# Patient Record
Sex: Female | Born: 1985 | Race: White | Hispanic: Yes | Marital: Married | Smoking: Never smoker
Health system: Southern US, Community
[De-identification: ages and names within clinical notes are randomized; demographics above are authoritative.]

## PROBLEM LIST (undated history)

## (undated) ENCOUNTER — Inpatient Hospital Stay (HOSPITAL_COMMUNITY): Payer: Managed Care, Other (non HMO)

## (undated) DIAGNOSIS — M25559 Pain in unspecified hip: Secondary | ICD-10-CM

## (undated) DIAGNOSIS — M549 Dorsalgia, unspecified: Secondary | ICD-10-CM

## (undated) DIAGNOSIS — N83209 Unspecified ovarian cyst, unspecified side: Secondary | ICD-10-CM

## (undated) HISTORY — DX: Unspecified ovarian cyst, unspecified side: N83.209

## (undated) HISTORY — DX: Dorsalgia, unspecified: M54.9

## (undated) HISTORY — PX: CHOLECYSTECTOMY: SHX55

## (undated) HISTORY — PX: KIDNEY STONE SURGERY: SHX686

## (undated) HISTORY — DX: Pain in unspecified hip: M25.559

---

## 2014-06-14 DIAGNOSIS — N83209 Unspecified ovarian cyst, unspecified side: Secondary | ICD-10-CM

## 2014-06-14 HISTORY — DX: Unspecified ovarian cyst, unspecified side: N83.209

## 2016-05-26 ENCOUNTER — Ambulatory Visit (INDEPENDENT_AMBULATORY_CARE_PROVIDER_SITE_OTHER): Payer: BLUE CROSS/BLUE SHIELD | Admitting: Obstetrics and Gynecology

## 2016-05-26 ENCOUNTER — Encounter: Payer: Self-pay | Admitting: Obstetrics and Gynecology

## 2016-05-26 VITALS — BP 102/60 | HR 68 | Resp 16 | Ht 65.0 in | Wt 198.0 lb

## 2016-05-26 DIAGNOSIS — R5383 Other fatigue: Secondary | ICD-10-CM

## 2016-05-26 DIAGNOSIS — Z01419 Encounter for gynecological examination (general) (routine) without abnormal findings: Secondary | ICD-10-CM | POA: Diagnosis not present

## 2016-05-26 DIAGNOSIS — R102 Pelvic and perineal pain: Secondary | ICD-10-CM

## 2016-05-26 DIAGNOSIS — Z124 Encounter for screening for malignant neoplasm of cervix: Secondary | ICD-10-CM

## 2016-05-26 DIAGNOSIS — Z0000 Encounter for general adult medical examination without abnormal findings: Secondary | ICD-10-CM

## 2016-05-26 DIAGNOSIS — Z3041 Encounter for surveillance of contraceptive pills: Secondary | ICD-10-CM | POA: Diagnosis not present

## 2016-05-26 DIAGNOSIS — N83202 Unspecified ovarian cyst, left side: Secondary | ICD-10-CM

## 2016-05-26 LAB — CBC
HCT: 42.5 % (ref 35.0–45.0)
Hemoglobin: 14.3 g/dL (ref 11.7–15.5)
MCH: 32.6 pg (ref 27.0–33.0)
MCHC: 33.6 g/dL (ref 32.0–36.0)
MCV: 97 fL (ref 80.0–100.0)
MPV: 10.6 fL (ref 7.5–12.5)
PLATELETS: 216 10*3/uL (ref 140–400)
RBC: 4.38 MIL/uL (ref 3.80–5.10)
RDW: 12.2 % (ref 11.0–15.0)
WBC: 5.5 10*3/uL (ref 3.8–10.8)

## 2016-05-26 LAB — COMPREHENSIVE METABOLIC PANEL
ALK PHOS: 36 U/L (ref 33–115)
ALT: 25 U/L (ref 6–29)
AST: 23 U/L (ref 10–30)
Albumin: 4.5 g/dL (ref 3.6–5.1)
BUN: 15 mg/dL (ref 7–25)
CHLORIDE: 102 mmol/L (ref 98–110)
CO2: 25 mmol/L (ref 20–31)
CREATININE: 0.85 mg/dL (ref 0.50–1.10)
Calcium: 9.5 mg/dL (ref 8.6–10.2)
GLUCOSE: 81 mg/dL (ref 65–99)
POTASSIUM: 4.1 mmol/L (ref 3.5–5.3)
SODIUM: 138 mmol/L (ref 135–146)
TOTAL PROTEIN: 7.3 g/dL (ref 6.1–8.1)
Total Bilirubin: 0.6 mg/dL (ref 0.2–1.2)

## 2016-05-26 LAB — LIPID PANEL
CHOL/HDL RATIO: 5.5 ratio — AB (ref <5.0)
CHOLESTEROL: 208 mg/dL — AB (ref <200)
HDL: 38 mg/dL — ABNORMAL LOW (ref >50)
LDL Cholesterol: 122 mg/dL — ABNORMAL HIGH (ref <100)
Triglycerides: 241 mg/dL — ABNORMAL HIGH (ref <150)
VLDL: 48 mg/dL — ABNORMAL HIGH (ref <30)

## 2016-05-26 LAB — TSH: TSH: 1.32 mIU/L

## 2016-05-26 MED ORDER — BLISOVI 24 FE 1-20 MG-MCG(24) PO TABS: 1.0000 | ORAL_TABLET | Freq: Every day | ORAL | 3 refills | Status: DC

## 2016-05-26 NOTE — Progress Notes (Signed)
30 y.o. DE:[id number] MarriedHispanicF here for annual exam. Patient is c/o LLQ pain.  The patient had large cyst on her left ovary last year. She was started on OCP's, never had a f/u ultrasound. She c/o intermittent pain in her LLQ for the last 4-5 months. She did have pain last year when the cyst was diagnosed but that pain resolved.  The current pain occurs a couple of times a week, sometimes she feels bloated. The pain can last for up to 3 hours. The pain is a burning pain, up to a 6/10 in severity. Normal BM every day, she does have more gas. No urinary c/o. She does c/o fatigue. No pain with intercourse.  She was on OCP's, ran out 2 weeks ago.  Period Cycle (Days): 28 Period Duration (Days): 4 days  Period Pattern: Regular Menstrual Flow: Moderate Menstrual Control: Thin pad, Maxi pad Dysmenorrhea: (!) Moderate Dysmenorrhea Symptoms: Cramping  Patient's last menstrual period was 05/23/2016.          Sexually active: Yes.    The current method of family planning is none.    Exercising: No.  The patient does not participate in regular exercise at present. Smoker:  no  Health Maintenance: Pap:  2016 WNL per patient  History of abnormal Pap:  no TDaP:  unsure Gardasil: no    reports that she has never smoked. She has never used smokeless tobacco. 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   Past Medical History:  Diagnosis Date  . Ovarian cyst 2016   left     Past Surgical History:  Procedure Laterality Date  . CESAREAN SECTION    . KIDNEY STONE SURGERY    C/S x 1  Current Outpatient Prescriptions  Medication Sig Dispense Refill  . BLISOVI 24 FE 1-20 MG-MCG(24) tablet     . COLLAGEN PO Take by mouth.     No current facility-administered medications for this visit.     Family History  Problem Relation Age of Onset  . Diabetes Father     Review of Systems  Constitutional: Negative.   HENT: Negative.    Eyes: Negative.   Respiratory: Negative.   Cardiovascular: Negative.   Gastrointestinal: Negative.   Endocrine: Negative.   Genitourinary: Positive for pelvic pain.  Musculoskeletal: Negative.   Skin: Negative.   Allergic/Immunologic: Negative.   Neurological: Negative.   Psychiatric/Behavioral: Negative.     Exam:   BP 102/60 (BP Location: Right Arm, Patient Position: Sitting, Cuff Size: Normal)   Pulse 68   Resp 16   Ht 5\' 5"  (1.651 m)   Wt 198 lb (89.8 kg)   LMP 05/23/2016   BMI 32.95 kg/m   Weight change: @WEIGHTCHANGE @ Height:   Height: 5\' 5"  (165.1 cm)  Ht Readings from Last 3 Encounters:  05/26/16 5\' 5"  (1.651 m)    General appearance: alert, cooperative and appears stated age Head: Normocephalic, without obvious abnormality, atraumatic Neck: no adenopathy, supple, symmetrical, trachea midline and thyroid normal to inspection and palpation Lungs: clear to auscultation bilaterally Cardiovascular: regular rate and rhythm Breasts: normal appearance, no masses or tenderness Heart: regular rate and rhythm Abdomen: soft, mildly tender in the LLQ in the region of the descending bowel; bowel sounds normal; no masses,  no organomegaly Extremities: extremities normal, atraumatic, no cyanosis or edema Skin: Skin color, texture, turgor normal. No rashes or lesions Lymph nodes: Cervical, supraclavicular, and axillary nodes normal. No abnormal inguinal  nodes palpated Neurologic: Grossly normal   Pelvic: External genitalia:  no lesions              Urethra:  normal appearing urethra with no masses, tenderness or lesions              Bartholins and Skenes: normal                 Vagina: normal appearing vagina with normal color and discharge, no lesions              Cervix: no lesions               Bimanual Exam:  Uterus:  normal size, contour, position, consistency, mobility, non-tender and anteverted              Adnexa: tender on the left, no clear mass noted                Rectovaginal: Confirms               Anus:  normal sphincter tone, no lesions  Chaperone was present for exam.  A:  Well Woman with normal exam  LLQ pelvic pain  Bloating  H/O large left ovarian cyst   Fatigue, will check CBC and TSH  P:   Pap with hpv  Screening labs  Restart OCP's  Discussed calcium and vit D  Schedule GYN ultrasound  Consider referral to GI if her u/s is normal

## 2016-05-26 NOTE — Patient Instructions (Signed)

## 2016-05-27 ENCOUNTER — Telehealth: Payer: Self-pay | Admitting: Obstetrics and Gynecology

## 2016-05-27 LAB — VITAMIN D 25 HYDROXY (VIT D DEFICIENCY, FRACTURES): VIT D 25 HYDROXY: 28 ng/mL — AB (ref 30–100)

## 2016-05-27 NOTE — Telephone Encounter (Signed)
Called patient to review benefits for a recommended procedure. Left Voicemail requesting a call back. °

## 2016-05-28 NOTE — Telephone Encounter (Signed)
Spoke with patient regarding benefit for u;trasound. Patient understood and agreeable. Patient ready to schedule. Patient scheduled 06/01/16 with Dr Talbert Nan. Patient aware of date,arrival time and cancellation policy. No further questions. Ok to close

## 2016-06-01 ENCOUNTER — Ambulatory Visit (INDEPENDENT_AMBULATORY_CARE_PROVIDER_SITE_OTHER): Payer: BLUE CROSS/BLUE SHIELD | Admitting: Obstetrics and Gynecology

## 2016-06-01 ENCOUNTER — Encounter: Payer: Self-pay | Admitting: Obstetrics and Gynecology

## 2016-06-01 ENCOUNTER — Ambulatory Visit (INDEPENDENT_AMBULATORY_CARE_PROVIDER_SITE_OTHER): Payer: BLUE CROSS/BLUE SHIELD

## 2016-06-01 VITALS — BP 110/70 | HR 64 | Resp 14 | Wt 194.0 lb

## 2016-06-01 DIAGNOSIS — N83202 Unspecified ovarian cyst, left side: Secondary | ICD-10-CM | POA: Diagnosis not present

## 2016-06-01 DIAGNOSIS — R102 Pelvic and perineal pain: Secondary | ICD-10-CM | POA: Diagnosis not present

## 2016-06-01 LAB — IPS PAP TEST WITH HPV

## 2016-06-01 NOTE — Patient Instructions (Signed)
Laparoscopia de diagnstico (Diagnostic Laparoscopy) La laparoscopia de diagnstico es un procedimiento que se hace para diagnosticar enfermedades en el abdomen. Durante su realizacin, se introduce en el abdomen un instrumento delgado del tamao de un lpiz que tiene Edmund luz, llamado laparoscopio, a travs de una incisin. El laparoscopio le permite al mdico observar los rganos internos. INFORME A SU MDICO:  Cualquier alergia que tenga.  Todos los [employer], incluidos vitaminas, hierbas, gotas oftlmicas, cremas y medicamentos de venta libre.  Problemas previos que usted o los [employer] de su familia hayan tenido con el uso de anestsicos.  Enfermedades de la sangre que tenga.  Si tiene cirugas previas.  Enfermedades que tenga. RIESGOS Y COMPLICACIONES En general, se trata de un procedimiento seguro. Sin embargo, es posible que haya problemas, que pueden incluir lo siguiente:  Infeccin.  Hemorragia.  Lesiones en los rganos circundantes.  Reaccin alrgica a la anestesia usada durante el procedimiento. ANTES DEL PROCEDIMIENTO  No coma ni beba nada despus de la medianoche anterior al procedimiento o segn lo que le haya indicado el mdico.  Consulte a su mdico acerca de estos temas:  Cambiar o suspender los medicamentos que toma habitualmente.  Tomar medicamentos, como aspirina e ibuprofeno. Estos medicamentos pueden tener un efecto anticoagulante en la [place]. No tome estos medicamentos antes del procedimiento si el mdico le indica que no lo haga.  Haga planes para que una persona lo lleve de vuelta a su casa despus del procedimiento. PROCEDIMIENTO  Le administrarn un medicamento para ayudarlo a relajarse (sedante).  Le administrarn un medicamento que lo har dormir (anestesia general).  Se inflar el abdomen con un gas, lo que facilitar la observacin.  Le practicarn incisiones pequeas en el abdomen.  A travs de las incisiones, se  introducirn un laparoscopio y otros instrumentos pequeos en el abdomen.  Se puede tomar Truddie Coco de tejido de un rgano del abdomen para su anlisis.  Se retirarn los instrumentos del abdomen.  Se extraer el gas.  Las incisiones se cerrarn con puntos (suturas). DESPUS DEL PROCEDIMIENTO Le controlarn con frecuencia la presin arterial, la frecuencia cardaca, la frecuencia respiratoria y Retail buyer de oxgeno en la sangre hasta que haya desaparecido el efecto de los medicamentos administrados. Esta informacin no tiene Marine scientist el consejo del mdico. Asegrese de hacerle al mdico cualquier pregunta que tenga. Document Released: 05/31/2005 Document Revised: 06/21/2014 Document Reviewed: 01/11/2014 Elsevier Interactive Patient Education  2017 [employer].

## 2016-06-01 NOTE — Progress Notes (Signed)
GYNECOLOGY  VISIT   HPI: 30 y.o.   Married  Hispanic  female   [phone] with Patient's last menstrual period was 05/23/2016.   here for follow up history of ovarian cyst. The patient was told she had a large left ovarian cyst last year. She moved and didn't have f/u until now. She is having intermittent pain in her LLQ for the last 4-5 months.    GYNECOLOGIC HISTORY: Patient's last menstrual period was 05/23/2016. Contraception:OCP Menopausal hormone therapy: none         OB History    Gravida Para Term Preterm AB Living   2 2 2     2    SAB TAB Ectopic Multiple Live Births           2         There are no active problems to display for this patient.   Past Medical History:  Diagnosis Date  . Ovarian cyst 2016   left     Past Surgical History:  Procedure Laterality Date  . CESAREAN SECTION    . KIDNEY STONE SURGERY      Current Outpatient Prescriptions  Medication Sig Dispense Refill  . BLISOVI 24 FE 1-20 MG-MCG(24) tablet Take 1 tablet by mouth daily. 3 Package 3  . COLLAGEN PO Take by mouth.     No current facility-administered medications for this visit.      ALLERGIES: Patient has no known allergies.  Family History  Problem Relation Age of Onset  . Diabetes Father     Social History   Social History  . Marital status: Married    Spouse name: N/A  . Number of children: N/A  . Years of education: N/A   Occupational History  . Not on file.   Social History Main Topics  . Smoking status: Never Smoker  . Smokeless tobacco: Never Used  . Alcohol use No  . Drug use: No  . Sexual activity: Yes    Partners: Male    Birth control/ protection: None   Other Topics Concern  . Not on file   Social History Narrative  . No narrative on file    Review of Systems  Constitutional: Negative.   HENT: Negative.   Eyes: Negative.   Respiratory: Negative.   Cardiovascular: Negative.   Gastrointestinal: Negative.   Genitourinary: Negative.    Musculoskeletal: Negative.   Skin: Negative.   Neurological: Negative.   Endo/Heme/Allergies: Negative.   Psychiatric/Behavioral: Negative.     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 Removal recommended    PLAN Laparoscopic left oophorectomy Discussed risks of bleeding, infection, damage to bowel/bladder/vessels/ureters   An After Visit Summary was printed and given to the patient.  15 minutes face to face time of which over 50% was spent in counseling.

## 2016-06-10 ENCOUNTER — Other Ambulatory Visit (INDEPENDENT_AMBULATORY_CARE_PROVIDER_SITE_OTHER): Payer: BLUE CROSS/BLUE SHIELD

## 2016-06-10 ENCOUNTER — Other Ambulatory Visit: Payer: Self-pay

## 2016-06-10 DIAGNOSIS — E785 Hyperlipidemia, unspecified: Secondary | ICD-10-CM

## 2016-06-10 DIAGNOSIS — E559 Vitamin D deficiency, unspecified: Secondary | ICD-10-CM

## 2016-06-11 LAB — LIPID PANEL
Cholesterol: 171 mg/dL (ref <200)
HDL: 46 mg/dL — ABNORMAL LOW (ref >50)
LDL CALC: 91 mg/dL (ref <100)
Total CHOL/HDL Ratio: 3.7 Ratio (ref <5.0)
Triglycerides: 172 mg/dL — ABNORMAL HIGH (ref <150)
VLDL: 34 mg/dL — ABNORMAL HIGH (ref <30)

## 2016-06-15 ENCOUNTER — Telehealth: Payer: Self-pay | Admitting: *Deleted

## 2016-06-15 NOTE — Telephone Encounter (Signed)
-----   Message from Nunzio Cobbs, MD sent at 06/13/2016 12:00 PM EST ----- This is Dr. Quincy Simmonds reviewing labs for Dr. Talbert Nan.  Please report repeat cholesterol panel to the patient.  She made great improvements in her cholesterol ratios and her triglycerides.  I recommend a diet low in saturated fats and cholesterol and vigorous exercise most days of the week.  She can test her cholesterol yearly at her annual exams with Dr. Talbert Nan.

## 2016-06-15 NOTE — Telephone Encounter (Signed)
Left message to call regarding lab results -eh 

## 2016-06-16 ENCOUNTER — Telehealth: Payer: Self-pay | Admitting: Obstetrics and Gynecology

## 2016-06-16 NOTE — Telephone Encounter (Signed)
Spoke with patient and gave results. See result note -eh

## 2016-06-16 NOTE — Telephone Encounter (Signed)
Spoke with patient in regards to benefits for recommended surgical procedure. Patient understood information presented. Patient advises she would like to discuss with her spouse before proceeding with scheduling. Patient to call back to our office after speaking with spouse.  Routing to [employer]

## 2016-06-16 NOTE — Telephone Encounter (Signed)
Call from patient's husband, Kevan Rosebush. (permission to speak to husband per [facility]). Desires to proceed with scheduling first available date. Has information for business office regarding insurance change. Call transferred to [facility] in business office. Will call back tomorrow with date options.

## 2016-06-22 NOTE — Telephone Encounter (Signed)
Call to patient. Discussed date of 06-29-16 and patient is agreeable. Will schedule and call her back once confirmed. Case request sent to central scheduling.

## 2016-06-22 NOTE — Telephone Encounter (Signed)
Patient called requesting to schedule surgery. °

## 2016-06-22 NOTE — Telephone Encounter (Signed)
Call to patient. Advised surgery confirmed for 06-29-16 at 0730 at [place] appointment scheduled with Dr Talbert Nan for tomorrow at 1030 to review surgery plan and instructions.   Routing to provider for final review. Patient agreeable to disposition. Will close encounter.

## 2016-06-23 ENCOUNTER — Encounter: Payer: Self-pay | Admitting: Obstetrics and Gynecology

## 2016-06-23 ENCOUNTER — Ambulatory Visit: Payer: Managed Care, Other (non HMO) | Admitting: Obstetrics and Gynecology

## 2016-06-23 VITALS — BP 102/60 | HR 64 | Resp 15 | Wt 203.0 lb

## 2016-06-23 DIAGNOSIS — N83202 Unspecified ovarian cyst, left side: Secondary | ICD-10-CM

## 2016-06-23 NOTE — H&P (Signed)
2. Nicholes Rough, CMA (Certified Medical Assistant) at 06/23/2016 10:49 AM - Sign at close encounter    GYNECOLOGY  VISIT   HPI: 31 y.o.   Married  Hispanic  female   G2P2002 with Patient's last menstrual period was 06/22/2016.   here for surgery consult, Patient is scheduled for laparoscopic oophorectomy (Left) 06/29/16. The patient has a 4-5 month h/o intermittent LLQ abdominal pain, worse with her cycle. Ultrasound with a 10 x 8 x 11 cm complex left ovarian mass, c/w a mucinous cystadenoma.      GYNECOLOGIC HISTORY: Patient's last menstrual period was 06/22/2016. Contraception:OCP Menopausal hormone therapy: none                 OB History    Gravida Para Term Preterm AB Living   2 2 2     2    SAB TAB Ectopic Multiple Live Births           2         There are no active problems to display for this patient.       Past Medical History:  Diagnosis Date  . Ovarian cyst 2016   left          Past Surgical History:  Procedure Laterality Date  . CESAREAN SECTION    . KIDNEY STONE SURGERY    Laparoscopic surgery for her kidney stone        Current Outpatient Prescriptions  Medication Sig Dispense Refill  . BLISOVI 24 FE 1-20 MG-MCG(24) tablet Take 1 tablet by mouth daily. 3 Package 3  . Cholecalciferol (VITAMIN D PO) Take 1 capsule by mouth daily.    . COLLAGEN PO Take 3 tablets by mouth daily.     No current facility-administered medications for this visit.      ALLERGIES: Patient has no known allergies.       Family History  Problem Relation Age of Onset  . Diabetes Father     Social History        Social History  . Marital status: Married    Spouse name: N/A  . Number of children: N/A  . Years of education: N/A      Occupational History  . Not on file.        Social History Main Topics  . Smoking status: Never Smoker  . Smokeless tobacco: Never Used  . Alcohol use No  . Drug use: No  . Sexual activity: Yes     Partners: Male    Birth control/ protection: None       Other Topics Concern  . Not on file      Social History Narrative  . No narrative on file    Review of Systems  Constitutional: Negative.   HENT: Negative.   Eyes: Negative.   Respiratory: Negative.   Cardiovascular: Negative.   Gastrointestinal: Negative.   Genitourinary: Negative.   Musculoskeletal: Negative.   Skin: Negative.   Neurological: Negative.   Endo/Heme/Allergies: Negative.   Psychiatric/Behavioral: Negative.     PHYSICAL EXAMINATION:    BP 102/60 (BP Location: Right Arm, Patient Position: Sitting, Cuff Size: Normal)   Pulse 64   Resp 15   Wt 203 lb (92.1 kg)   LMP 06/22/2016   BMI 33.78 kg/m     General appearance: alert, cooperative and appears stated age Neck: no adenopathy, supple, symmetrical, trachea midline and thyroid normal to inspection and palpation Heart: regular rate and rhythm Lungs: CTAB Abdomen: soft, non-tender; bowel  sounds normal; no masses,  no organomegaly Skin: normal color, texture and turgor, no rashes or lesions Lymph: normal cervical supraclavicular and inguinal nodes Neurologic: grossly normal   ASSESSMENT 11 cm complex left ovarian cyst, c/w a mucinous cystadenoma    PLAN Laparoscopic left oophorectomy Discussed surgery and recovery time Discussed risks, including: bleeding, infection, damage to bowel/bladder/vessels/ureters, need for further surgery   An After Visit Summary was printed and given to the patient.

## 2016-06-23 NOTE — Progress Notes (Signed)
GYNECOLOGY  VISIT   HPI: 31 y.o.   Married  Hispanic  female   G2P2002 with Patient's last menstrual period was 06/22/2016.   here for surgery consult, Patient is scheduled for laparoscopic oophorectomy (Left) 06/29/16. The patient has a 4-5 month h/o intermittent LLQ abdominal pain, worse with her cycle. Ultrasound with a 10 x 8 x 11 cm complex left ovarian mass, c/w a mucinous cystadenoma.      GYNECOLOGIC HISTORY: Patient's last menstrual period was 06/22/2016. Contraception:OCP Menopausal hormone therapy: none         OB History    Gravida Para Term Preterm AB Living   2 2 2     2    SAB TAB Ectopic Multiple Live Births           2         There are no active problems to display for this patient.   Past Medical History:  Diagnosis Date  . Ovarian cyst 2016   left     Past Surgical History:  Procedure Laterality Date  . CESAREAN SECTION    . KIDNEY STONE SURGERY    Laparoscopic surgery for her kidney stone  Current Outpatient Prescriptions  Medication Sig Dispense Refill  . BLISOVI 24 FE 1-20 MG-MCG(24) tablet Take 1 tablet by mouth daily. 3 Package 3  . Cholecalciferol (VITAMIN D PO) Take 1 capsule by mouth daily.    . COLLAGEN PO Take 3 tablets by mouth daily.     No current facility-administered medications for this visit.      ALLERGIES: Patient has no known allergies.  Family History  Problem Relation Age of Onset  . Diabetes Father     Social History   Social History  . Marital status: Married    Spouse name: N/A  . Number of children: N/A  . Years of education: N/A   Occupational History  . Not on file.   Social History Main Topics  . Smoking status: Never Smoker  . Smokeless tobacco: Never Used  . Alcohol use No  . Drug use: No  . Sexual activity: Yes    Partners: Male    Birth control/ protection: None   Other Topics Concern  . Not on file   Social History Narrative  . No narrative on file    Review of Systems  Constitutional:  Negative.   HENT: Negative.   Eyes: Negative.   Respiratory: Negative.   Cardiovascular: Negative.   Gastrointestinal: Negative.   Genitourinary: Negative.   Musculoskeletal: Negative.   Skin: Negative.   Neurological: Negative.   Endo/Heme/Allergies: Negative.   Psychiatric/Behavioral: Negative.     PHYSICAL EXAMINATION:    BP 102/60 (BP Location: Right Arm, Patient Position: Sitting, Cuff Size: Normal)   Pulse 64   Resp 15   Wt 203 lb (92.1 kg)   LMP 06/22/2016   BMI 33.78 kg/m     General appearance: alert, cooperative and appears stated age Neck: no adenopathy, supple, symmetrical, trachea midline and thyroid normal to inspection and palpation Heart: regular rate and rhythm Lungs: CTAB Abdomen: soft, non-tender; bowel sounds normal; no masses,  no organomegaly Skin: normal color, texture and turgor, no rashes or lesions Lymph: normal cervical supraclavicular and inguinal nodes Neurologic: grossly normal   ASSESSMENT 11 cm complex left ovarian cyst, c/w a mucinous cystadenoma    PLAN Laparoscopic left oophorectomy Discussed surgery and recovery time Discussed risks, including: bleeding, infection, damage to bowel/bladder/vessels/ureters, need for further surgery   An After Visit  Summary was printed and given to the patient.

## 2016-06-28 ENCOUNTER — Encounter (HOSPITAL_COMMUNITY): Payer: Self-pay | Admitting: Anesthesiology

## 2016-06-28 NOTE — Anesthesia Preprocedure Evaluation (Addendum)
Anesthesia Evaluation  Patient identified by MRN, date of birth, ID band Patient awake    Reviewed: Allergy & Precautions, NPO status , Patient's Chart, lab work & pertinent test results  Airway Mallampati: II  TM Distance: >3 FB Neck ROM: Full    Dental  (+) Teeth Intact, Dental Advisory Given   Pulmonary neg pulmonary ROS,    breath sounds clear to auscultation       Cardiovascular negative cardio ROS   Rhythm:Regular Rate:Normal     Neuro/Psych negative neurological ROS  negative psych ROS   GI/Hepatic negative GI ROS, Neg liver ROS,   Endo/Other  negative endocrine ROS  Renal/GU negative Renal ROS  negative genitourinary   Musculoskeletal negative musculoskeletal ROS (+)   Abdominal   Peds negative pediatric ROS (+)  Hematology negative hematology ROS (+)   Anesthesia Other Findings   Reproductive/Obstetrics negative OB ROS                            Anesthesia Physical Anesthesia Plan  ASA: II  Anesthesia Plan: General   Post-op Pain Management:    Induction: Intravenous  Airway Management Planned: Oral ETT  Additional Equipment:   Intra-op Plan:   Post-operative Plan: Extubation in OR  Informed Consent: I have reviewed the patients History and Physical, chart, labs and discussed the procedure including the risks, benefits and alternatives for the proposed anesthesia with the patient or authorized representative who has indicated his/her understanding and acceptance.   Dental advisory given  Plan Discussed with: CRNA  Anesthesia Plan Comments:         Anesthesia Quick Evaluation

## 2016-06-29 ENCOUNTER — Ambulatory Visit (HOSPITAL_COMMUNITY): Payer: Managed Care, Other (non HMO) | Admitting: Anesthesiology

## 2016-06-29 ENCOUNTER — Encounter (HOSPITAL_COMMUNITY): Payer: Self-pay | Admitting: *Deleted

## 2016-06-29 ENCOUNTER — Encounter (HOSPITAL_COMMUNITY)
Admission: RE | Disposition: A | Discharge status: Home / Self Care | Payer: Self-pay | Source: Ambulatory Visit | Attending: Obstetrics and Gynecology

## 2016-06-29 ENCOUNTER — Ambulatory Visit (HOSPITAL_COMMUNITY)
Admission: RE | Admit: 2016-06-29 | Discharge: 2016-06-29 | Disposition: A | Discharge status: Home / Self Care | Payer: Managed Care, Other (non HMO) | Source: Ambulatory Visit | Attending: Obstetrics and Gynecology | Admitting: Obstetrics and Gynecology

## 2016-06-29 DIAGNOSIS — N83202 Unspecified ovarian cyst, left side: Secondary | ICD-10-CM | POA: Diagnosis not present

## 2016-06-29 DIAGNOSIS — D271 Benign neoplasm of left ovary: Secondary | ICD-10-CM | POA: Insufficient documentation

## 2016-06-29 DIAGNOSIS — N839 Noninflammatory disorder of ovary, fallopian tube and broad ligament, unspecified: Secondary | ICD-10-CM | POA: Diagnosis present

## 2016-06-29 HISTORY — PX: CYSTOSCOPY: SHX5120

## 2016-06-29 HISTORY — PX: LAPAROSCOPY: SHX197

## 2016-06-29 LAB — CBC
HEMATOCRIT: 41.9 % (ref 36.0–46.0)
HEMOGLOBIN: 14.8 g/dL (ref 12.0–15.0)
MCH: 32.8 pg (ref 26.0–34.0)
MCHC: 35.3 g/dL (ref 30.0–36.0)
MCV: 92.9 fL (ref 78.0–100.0)
Platelets: 228 10*3/uL (ref 150–400)
RBC: 4.51 MIL/uL (ref 3.87–5.11)
RDW: 11.9 % (ref 11.5–15.5)
WBC: 5.6 10*3/uL (ref 4.0–10.5)

## 2016-06-29 LAB — PREGNANCY, URINE: Preg Test, Ur: NEGATIVE

## 2016-06-29 SURGERY — LAPAROSCOPY OPERATIVE
Anesthesia: General | Site: Bladder

## 2016-06-29 MED ORDER — FENTANYL CITRATE (PF) 250 MCG/5ML IJ SOLN
INTRAMUSCULAR | Status: AC
  Filled 2016-06-29: qty 5

## 2016-06-29 MED ORDER — LACTATED RINGERS IV SOLN: INTRAVENOUS | Status: DC

## 2016-06-29 MED ORDER — DEXAMETHASONE SODIUM PHOSPHATE 10 MG/ML IJ SOLN
INTRAMUSCULAR | Status: DC | PRN
  Administered 2016-06-29: 10 mg via INTRAVENOUS

## 2016-06-29 MED ORDER — IBUPROFEN 800 MG PO TABS: 800.0000 mg | ORAL_TABLET | Freq: Three times a day (TID) | ORAL | 0 refills | Status: DC | PRN

## 2016-06-29 MED ORDER — DEXAMETHASONE SODIUM PHOSPHATE 10 MG/ML IJ SOLN
INTRAMUSCULAR | Status: AC
  Filled 2016-06-29: qty 1

## 2016-06-29 MED ORDER — MIDAZOLAM HCL 2 MG/2ML IJ SOLN
INTRAMUSCULAR | Status: AC
  Filled 2016-06-29: qty 2

## 2016-06-29 MED ORDER — METOCLOPRAMIDE HCL 5 MG/ML IJ SOLN
INTRAMUSCULAR | Status: AC
  Administered 2016-06-29: 10 mg via INTRAVENOUS
  Filled 2016-06-29: qty 2

## 2016-06-29 MED ORDER — ONDANSETRON HCL 4 MG/2ML IJ SOLN
INTRAMUSCULAR | Status: DC | PRN
  Administered 2016-06-29: 4 mg via INTRAVENOUS

## 2016-06-29 MED ORDER — OXYCODONE-ACETAMINOPHEN 5-325 MG PO TABS: 1.0000 | ORAL_TABLET | ORAL | 0 refills | Status: DC | PRN

## 2016-06-29 MED ORDER — LACTATED RINGERS IV SOLN
INTRAVENOUS | Status: DC
  Administered 2016-06-29 (×2): via INTRAVENOUS

## 2016-06-29 MED ORDER — LIDOCAINE HCL (CARDIAC) 20 MG/ML IV SOLN
INTRAVENOUS | Status: AC
  Filled 2016-06-29: qty 5

## 2016-06-29 MED ORDER — GLYCOPYRROLATE 0.2 MG/ML IJ SOLN
INTRAMUSCULAR | Status: DC | PRN
  Administered 2016-06-29: .7 mg via INTRAVENOUS

## 2016-06-29 MED ORDER — BUPIVACAINE HCL (PF) 0.25 % IJ SOLN
INTRAMUSCULAR | Status: DC | PRN
  Administered 2016-06-29: 12 mL

## 2016-06-29 MED ORDER — HYDROMORPHONE HCL 1 MG/ML IJ SOLN
0.2500 mg | INTRAMUSCULAR | Status: DC | PRN
  Administered 2016-06-29 (×2): 0.5 mg via INTRAVENOUS

## 2016-06-29 MED ORDER — OXYCODONE HCL 5 MG PO TABS
ORAL_TABLET | ORAL | Status: AC
  Filled 2016-06-29: qty 1

## 2016-06-29 MED ORDER — LIDOCAINE HCL (CARDIAC) 20 MG/ML IV SOLN
INTRAVENOUS | Status: DC | PRN
  Administered 2016-06-29: 60 mg via INTRAVENOUS

## 2016-06-29 MED ORDER — ONDANSETRON HCL 4 MG/2ML IJ SOLN
INTRAMUSCULAR | Status: AC
  Filled 2016-06-29: qty 2

## 2016-06-29 MED ORDER — PROPOFOL 10 MG/ML IV BOLUS
INTRAVENOUS | Status: AC
  Filled 2016-06-29: qty 20

## 2016-06-29 MED ORDER — LACTATED RINGERS IR SOLN
Status: DC | PRN
  Administered 2016-06-29: 3000 mL

## 2016-06-29 MED ORDER — FENTANYL CITRATE (PF) 250 MCG/5ML IJ SOLN
INTRAMUSCULAR | Status: DC | PRN
  Administered 2016-06-29: 50 ug via INTRAVENOUS
  Administered 2016-06-29 (×2): 100 ug via INTRAVENOUS

## 2016-06-29 MED ORDER — MIDAZOLAM HCL 5 MG/5ML IJ SOLN
INTRAMUSCULAR | Status: DC | PRN
  Administered 2016-06-29: 2 mg via INTRAVENOUS

## 2016-06-29 MED ORDER — METOCLOPRAMIDE HCL 5 MG/ML IJ SOLN
10.0000 mg | Freq: Once | INTRAMUSCULAR | Status: AC
  Administered 2016-06-29: 10 mg via INTRAVENOUS

## 2016-06-29 MED ORDER — SCOPOLAMINE 1 MG/3DAYS TD PT72
MEDICATED_PATCH | TRANSDERMAL | Status: AC
  Administered 2016-06-29: 1.5 mg via TRANSDERMAL
  Filled 2016-06-29: qty 1

## 2016-06-29 MED ORDER — PROPOFOL 10 MG/ML IV BOLUS
INTRAVENOUS | Status: DC | PRN
  Administered 2016-06-29: 150 mg via INTRAVENOUS

## 2016-06-29 MED ORDER — SCOPOLAMINE 1 MG/3DAYS TD PT72
1.0000 | MEDICATED_PATCH | Freq: Once | TRANSDERMAL | Status: DC
  Administered 2016-06-29: 1.5 mg via TRANSDERMAL

## 2016-06-29 MED ORDER — MEPERIDINE HCL 25 MG/ML IJ SOLN: 6.2500 mg | INTRAMUSCULAR | Status: DC | PRN

## 2016-06-29 MED ORDER — PROMETHAZINE HCL 25 MG/ML IJ SOLN: 6.2500 mg | INTRAMUSCULAR | Status: DC | PRN

## 2016-06-29 MED ORDER — NEOSTIGMINE METHYLSULFATE 10 MG/10ML IV SOLN
INTRAVENOUS | Status: AC
  Filled 2016-06-29: qty 1

## 2016-06-29 MED ORDER — BUPIVACAINE HCL (PF) 0.25 % IJ SOLN
INTRAMUSCULAR | Status: AC
Start: 2016-06-29 — End: 2016-06-29
  Filled 2016-06-29: qty 30

## 2016-06-29 MED ORDER — OXYCODONE HCL 5 MG PO TABS
5.0000 mg | ORAL_TABLET | Freq: Once | ORAL | Status: AC | PRN
  Administered 2016-06-29: 5 mg via ORAL

## 2016-06-29 MED ORDER — HYDROMORPHONE HCL 1 MG/ML IJ SOLN
INTRAMUSCULAR | Status: AC
  Administered 2016-06-29: 0.5 mg via INTRAVENOUS
  Filled 2016-06-29: qty 1

## 2016-06-29 MED ORDER — GLYCOPYRROLATE 0.2 MG/ML IJ SOLN
INTRAMUSCULAR | Status: AC
  Filled 2016-06-29: qty 4

## 2016-06-29 MED ORDER — NEOSTIGMINE METHYLSULFATE 10 MG/10ML IV SOLN
INTRAVENOUS | Status: DC | PRN
  Administered 2016-06-29: 3.5 mg via INTRAVENOUS

## 2016-06-29 MED ORDER — OXYCODONE HCL 5 MG/5ML PO SOLN: 5.0000 mg | Freq: Once | ORAL | Status: AC | PRN

## 2016-06-29 MED ORDER — ROCURONIUM BROMIDE 100 MG/10ML IV SOLN
INTRAVENOUS | Status: DC | PRN
  Administered 2016-06-29: 10 mg via INTRAVENOUS
  Administered 2016-06-29: 30 mg via INTRAVENOUS

## 2016-06-29 SURGICAL SUPPLY — 40 items
APPLICATOR ARISTA FLEXITIP XL (MISCELLANEOUS) IMPLANT
CABLE HIGH FREQUENCY MONO STRZ (ELECTRODE) IMPLANT
CANISTER SUCT 3000ML (MISCELLANEOUS) ×4 IMPLANT
CLOTH BEACON ORANGE TIMEOUT ST (SAFETY) ×4 IMPLANT
DERMABOND ADVANCED (GAUZE/BANDAGES/DRESSINGS)
DERMABOND ADVANCED .7 DNX12 (GAUZE/BANDAGES/DRESSINGS) IMPLANT
DRSG OPSITE POSTOP 3X4 (GAUZE/BANDAGES/DRESSINGS) ×4 IMPLANT
DURAPREP 26ML APPLICATOR (WOUND CARE) ×4 IMPLANT
GLOVE BIO SURGEON STRL SZ 6.5 (GLOVE) ×6 IMPLANT
GLOVE BIO SURGEONS STRL SZ 6.5 (GLOVE) ×2
GLOVE BIOGEL PI IND STRL 7.0 (GLOVE) ×4 IMPLANT
GLOVE BIOGEL PI INDICATOR 7.0 (GLOVE) ×4
GOWN STRL REUS W/TWL LRG LVL3 (GOWN DISPOSABLE) ×8 IMPLANT
HEMOSTAT ARISTA ABSORB 3G PWDR (MISCELLANEOUS) IMPLANT
LIGASURE VESSEL 5MM BLUNT TIP (ELECTROSURGICAL) ×3 IMPLANT
NEEDLE INSUFFLATION 120MM (ENDOMECHANICALS) ×4 IMPLANT
NS IRRIG 1000ML POUR BTL (IV SOLUTION) ×4 IMPLANT
PACK LAPAROSCOPY BASIN (CUSTOM PROCEDURE TRAY) ×4 IMPLANT
PACK TRENDGUARD 450 HYBRID PRO (MISCELLANEOUS) ×2 IMPLANT
PACK TRENDGUARD 600 HYBRD PROC (MISCELLANEOUS) IMPLANT
POUCH LAPAROSCOPIC INSTRUMENT (MISCELLANEOUS) IMPLANT
PROTECTOR NERVE ULNAR (MISCELLANEOUS) ×8 IMPLANT
SCISSORS LAP 5X35 DISP (ENDOMECHANICALS) IMPLANT
SET CYSTO W/LG BORE CLAMP LF (SET/KITS/TRAYS/PACK) IMPLANT
SET IRRIG TUBING LAPAROSCOPIC (IRRIGATION / IRRIGATOR) ×4 IMPLANT
SHEARS HARMONIC ACE PLUS 36CM (ENDOMECHANICALS) IMPLANT
SLEEVE ADV FIXATION 5X100MM (TROCAR) IMPLANT
SLEEVE XCEL OPT CAN 5 100 (ENDOMECHANICALS) IMPLANT
SUT VICRYL 0 UR6 27IN ABS (SUTURE) ×4 IMPLANT
SUT VICRYL 4-0 PS2 18IN ABS (SUTURE) ×4 IMPLANT
SYSTEM CARTER THOMASON II (TROCAR) ×4 IMPLANT
TOWEL OR 17X24 6PK STRL BLUE (TOWEL DISPOSABLE) ×8 IMPLANT
TRAY FOLEY CATH SILVER 14FR (SET/KITS/TRAYS/PACK) ×4 IMPLANT
TRENDGUARD 450 HYBRID PRO PACK (MISCELLANEOUS) ×4
TRENDGUARD 600 HYBRID PROC PK (MISCELLANEOUS)
TROCAR ADV FIXATION 5X100MM (TROCAR) ×4 IMPLANT
TROCAR BLADELESS 15MM (ENDOMECHANICALS) ×4 IMPLANT
TROCAR XCEL NON-BLD 11X100MML (ENDOMECHANICALS) ×4 IMPLANT
TROCAR XCEL NON-BLD 5MMX100MML (ENDOMECHANICALS) IMPLANT
WARMER LAPAROSCOPE (MISCELLANEOUS) ×4 IMPLANT

## 2016-06-29 NOTE — Anesthesia Procedure Notes (Signed)
Procedure Name: Intubation Date/Time: 06/29/2016 7:30 AM Performed by: Ignacia Bayley Pre-anesthesia Checklist: Patient identified, Emergency Drugs available, Suction available and Patient being monitored Patient Re-evaluated:Patient Re-evaluated prior to inductionOxygen Delivery Method: Circle system utilized Preoxygenation: Pre-oxygenation with 100% oxygen Intubation Type: IV induction Ventilation: Mask ventilation without difficulty Laryngoscope Size: Miller and 2 Grade View: Grade I Tube type: Oral Tube size: 7.0 mm Number of attempts: 1 Airway Equipment and Method: Stylet Placement Confirmation: ETT inserted through vocal cords under direct vision,  positive ETCO2 and breath sounds checked- equal and bilateral Secured at: 21 cm Tube secured with: Tape Dental Injury: Teeth and Oropharynx as per pre-operative assessment

## 2016-06-29 NOTE — Op Note (Signed)
Preoperative Diagnosis: 11 cm complex left ovarian mass  Postoperative Diagnosis: same   Procedure:  Diagnostic Laparoscopy, left salpingo-oophorectomy, cystoscopy  Surgeon: Dr Sumner Boast  Assistant: Dr Edwinna Areola  Anesthesia: General  EBL: 10 cc  Fluids: 1,700 cc  Urine output: 75 cc  Complications: none  Indications for surgery: The patient is a 31 year old female, who presented with LLQ abdominal/pelvic pain and a known history of a large left ovarian cyst. 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  The patient is aware of the risks and complications involved with the surgery and consent was obtained prior to the procedure.  Findings: EUA: Large pelvic mass, unable to differentiate her ovary from her uterus.  Laparoscopy: Large left ovarian mass, the tube was stretched out over and snuggly attached to her ovary. Normal uterus and right adnexa. Normal liver edge, normal appendix.   Procedure: The patient was taken to the operating room with an IV in placed. She was placed in the dorsal lithotomy position. General anesthesia was administered. She was prepped and draped in the usual sterile fashion for an abdominal, vaginal surgery. A hulka uterine manipulator was placed. A foley catheter was placed.   The umbilicus was everted, injected with 0.25% marcaine and incised with a # 11 blade. 2 towel clips were used to elevated the umbilicus and a veress needle was placed into the abdominal cavity. The abdominal cavity was insufflated with CO2, with normal intraabdominal pressures. After adequate pneumo-insufflation the veress needle was removed and the 5 mm laparoscope was placed into the abdominal cavity using the opti-view trocar. The patient was placed in trendelenburg and the abdominal pelvic cavity was inspected. 2 more trocars were placed. 1 in the left  lower quadrant approximately 3 cm medial to the  anterior superior iliac spine, and one in the lateral right mid abdomen. These areas were injected with 0.25% marcaine, incised with a #11 blades. A 5 mm trocar was inserted in the left lower quadrant and an 15 mm trocar in the right mid abdomen. Both were inserted with direct visualization with the laparoscope. The abdominal pelvic cavity was again inspected. The ureters were identified bilaterally.   The left distal tube was separated from the ovary using the ligasure device. The cautery effect from the ligasure was very close to the fimbria. The decision was made to remove the tube with the ovary, instead of risking leaving a damaged tube behind. The left infundibulopelvic ligament was cauterized and cut with the ligasure device. The mesovarium and mesosalpinx were cauterized and cut with the ligasure device. The proximal portion of the tube was separated from the uterus using the ligasure device. The uterine ovarian ligament was cauterized and cut with the ligasure device. Hemostasis was excellent. Pelvic washing were obtained.   The 15 cm endo-bag was inserted through the 15 mm trocar and the left tube and ovary were placed in the bag. The trocar was removed and the bag was brought up to the abdominal wall. The cyst was incised and drained with suction. The cyst wall was removed in pieces, the bag remained intact, no intraperitoneal leakage. The bag was removed and the [employer] device was used to place a stich of 0-Vicryl through the fascia. The surgery site was inspected and hemostasis remained excellent.   The abdominal cavity was desufflated and the trocars were removed. The larger skin incision was closed with subcuticular stiches  of 4-0 vicryl and dermabond was placed over all of the incisions.  The tenaculum was removed. She had some vaginal bleeding. A speculum exam revealed that the hulka tenaculum site was oozing, it was noted to be close to the bladder. The bleeding stopped with pressure.  The foley catheter was removed and cystoscopy was performed. The bladder was intact without any areas of irritation or bleeding. Bilateral ureteral jets were seen. The bladder was emptied and the cystoscope was removed.   The patient's abdomen and perineum were cleansed and she was taken out of the dorsal lithotomy position. Upon awakening she was extubated and taken to the recovery room in stable condition. The sponge and instrument counts were correct.

## 2016-06-29 NOTE — Interval H&P Note (Signed)
History and Physical Interval Note:  06/29/2016 7:05 AM  Rita Stevens  has presented today for surgery, with the diagnosis of left ovarian cyst  The various methods of treatment have been discussed with the patient and family. After consideration of risks, benefits and other options for treatment, the patient has consented to  Procedure(s) with comments: LAPAROSCOPY OPERATIVE (Left) - left oopherectomy  as a surgical intervention .  The patient's history has been reviewed, patient examined, no change in status, stable for surgery.  I have reviewed the patient's chart and labs.  Questions were answered to the patient's satisfaction.     Salvadore Dom

## 2016-06-29 NOTE — Anesthesia Postprocedure Evaluation (Signed)
Anesthesia Post Note  Patient: Madisun Stuckman  Procedure(s) Performed: Procedure(s) (LRB): LAPAROSCOPY OPERATIVE with Left ovarian cystectomy and left paratubal cystectomy (Left) CYSTOSCOPY (N/A)  Patient location during evaluation: PACU Anesthesia Type: General Level of consciousness: awake and alert and oriented Pain management: pain level controlled Vital Signs Assessment: post-procedure vital signs reviewed and stable Respiratory status: spontaneous breathing, nonlabored ventilation and respiratory function stable Cardiovascular status: blood pressure returned to baseline and stable Postop Assessment: no signs of nausea or vomiting Anesthetic complications: no        Last Vitals:  Vitals:   06/29/16 0930 06/29/16 0945  BP: 110/73 106/65  Pulse: 86 82  Resp: 12 11  Temp:      Last Pain:  Vitals:   06/29/16 0929  TempSrc:   PainSc: 6    Pain Goal: Patients Stated Pain Goal: 2 (06/29/16 0627)               Zaiden Ludlum A.

## 2016-06-29 NOTE — Transfer of Care (Signed)
Immediate Anesthesia Transfer of Care Note  Patient: Rita Stevens  Procedure(s) Performed: Procedure(s) with comments: LAPAROSCOPY OPERATIVE with Left ovarian cystectomy and left paratubal cystectomy (Left) - left oopherectomy  CYSTOSCOPY (N/A)  Patient Location: PACU  Anesthesia Type:General  Level of Consciousness: awake  Airway & Oxygen Therapy: Patient Spontanous Breathing and Patient connected to nasal cannula oxygen  Post-op Assessment: Report given to RN and Post -op Vital signs reviewed and stable  Post vital signs: stable  Last Vitals:  Vitals:   06/29/16 0627  BP: 114/81  Pulse: 74  Resp: 18  Temp: 36.8 C    Last Pain:  Vitals:   06/29/16 0627  TempSrc: Oral  PainSc: 2       Patients Stated Pain Goal: 2 ([id number])  Complications: No apparent anesthesia complications

## 2016-06-29 NOTE — Discharge Instructions (Signed)
DISCHARGE INSTRUCTIONS: Laparoscopy ° °The following instructions have been prepared to help you care for yourself upon your return home today. ° °Wound care: °• Do not get the incision wet for the first 24 hours. The incision should be kept clean and dry. °• The Band-Aids or dressings may be removed the day after surgery. °• Should the incision become sore, red, and swollen after the first week, check with your doctor. ° °Personal hygiene: °• Shower the day after your procedure. ° °Activity and limitations: °• Do NOT drive or operate any equipment today. °• Do NOT lift anything more than 15 pounds for 2-3 weeks after surgery. °• Do NOT rest in bed all day. °• Walking is encouraged. Walk each day, starting slowly with 5-minute walks 3 or 4 times a day. Slowly increase the length of your walks. °• Walk up and down stairs slowly. °• Do NOT do strenuous activities, such as golfing, playing tennis, bowling, running, biking, weight lifting, gardening, mowing, or vacuuming for 2-4 weeks. Ask your doctor when it is okay to start. ° °Diet: Eat a light meal as desired this evening. You may resume your usual diet tomorrow. ° °Return to work: This is dependent on the type of work you do. For the most part you can return to a desk job within a week of surgery. If you are more active at work, please discuss this with your doctor. ° °What to expect after your surgery: You may have a slight burning sensation when you urinate on the first day. You may have a very small amount of blood in the urine. Expect to have a small amount of vaginal discharge/light bleeding for 1-2 weeks. It is not unusual to have abdominal soreness and bruising for up to 2 weeks. You may be tired and need more rest for about 1 week. You may experience shoulder pain for 24-72 hours. Lying flat in bed may relieve it. ° °Call your doctor for any of the following: °• Develop a fever of 100.4 or greater °• Inability to urinate 6 hours after discharge from  hospital °• Severe pain not relieved by pain medications °• Persistent of heavy bleeding at incision site °• Redness or swelling around incision site after a week °• Increasing nausea or vomiting ° °Patient Signature________________________________________ °Nurse Signature_________________________________________DISCHARGE INSTRUCTIONS: Laparoscopy ° °The following instructions have been prepared to help you care for yourself upon your return home today. ° °Wound care: °• Do not get the incision wet for the first 24 hours. The incision should be kept clean and dry. °• The Band-Aids or dressings may be removed the day after surgery. °• Should the incision become sore, red, and swollen after the first week, check with your doctor. ° °Personal hygiene: °• Shower the day after your procedure. ° °Activity and limitations: °• Do NOT drive or operate any equipment today. °• Do NOT lift anything more than 15 pounds for 2-3 weeks after surgery. °• Do NOT rest in bed all day. °• Walking is encouraged. Walk each day, starting slowly with 5-minute walks 3 or 4 times a day. Slowly increase the length of your walks. °• Walk up and down stairs slowly. °• Do NOT do strenuous activities, such as golfing, playing tennis, bowling, running, biking, weight lifting, gardening, mowing, or vacuuming for 2-4 weeks. Ask your doctor when it is okay to start. ° °Diet: Eat a light meal as desired this evening. You may resume your usual diet tomorrow. ° °Return to work: This is dependent on   the type of work you do. For the most part you can return to a desk job within a week of surgery. If you are more active at work, please discuss this with your doctor. ° °What to expect after your surgery: You may have a slight burning sensation when you urinate on the first day. You may have a very small amount of blood in the urine. Expect to have a small amount of vaginal discharge/light bleeding for 1-2 weeks. It is not unusual to have abdominal soreness  and bruising for up to 2 weeks. You may be tired and need more rest for about 1 week. You may experience shoulder pain for 24-72 hours. Lying flat in bed may relieve it. ° °Call your doctor for any of the following: °• Develop a fever of 100.4 or greater °• Inability to urinate 6 hours after discharge from hospital °• Severe pain not relieved by pain medications °• Persistent of heavy bleeding at incision site °• Redness or swelling around incision site after a week °• Increasing nausea or vomiting ° °Patient Signature________________________________________ °Nurse Signature_________________________________________ °

## 2016-06-30 ENCOUNTER — Encounter (HOSPITAL_COMMUNITY): Payer: Self-pay | Admitting: Obstetrics and Gynecology

## 2016-07-05 ENCOUNTER — Telehealth: Payer: Self-pay | Admitting: *Deleted

## 2016-07-05 NOTE — Telephone Encounter (Signed)
Patient called to schedule post op appointment. Patient then stated that she needed refill of pain medication. States she is out of medication and was uncomfortable enough that she was not able to sleep last night. Has tried Motrin without relief. Denies pain worsening, states it is getting better but requests refill of medication. Advised will need office visit for evaluation. Appointment scheduled for 07-06-16 at 1100 with Dr Talbert Nan.  Routing to provider for final review. Patient agreeable to disposition. Will close encounter.

## 2016-07-06 ENCOUNTER — Ambulatory Visit: Payer: Managed Care, Other (non HMO) | Admitting: Obstetrics and Gynecology

## 2016-07-06 ENCOUNTER — Telehealth: Payer: Self-pay

## 2016-07-06 NOTE — Telephone Encounter (Signed)
Spoke with patient at time of incoming call. Patient would like to reschedule her post-op appointment for this morning. Patient's husband is unable to bring her to the office and the patient is unable to drive. Would like to move appointment to next Thursday when her husband can come to the appointment. Appointment rescheduled for 07/15/2016 at 11:30 am with Dr.Jertson. Patient is agreeable to date and time. Patient states that she is feeling much better and feels her pain is controlled at this time. Is taking Ibuprofen every 6-8 hours. Aware if her pain is worsening or uncontrolled she will need to be seen in the office with Dr.Jertson earlier for evaluation for refill of pain medication if needed. Patient verbalizes understanding.   Routing to provider for final review. Patient agreeable to disposition. Will close encounter.

## 2016-07-15 ENCOUNTER — Ambulatory Visit (INDEPENDENT_AMBULATORY_CARE_PROVIDER_SITE_OTHER): Payer: Managed Care, Other (non HMO) | Admitting: Obstetrics and Gynecology

## 2016-07-15 ENCOUNTER — Encounter: Payer: Self-pay | Admitting: Obstetrics and Gynecology

## 2016-07-15 VITALS — BP 102/70 | HR 84 | Resp 14 | Wt 203.0 lb

## 2016-07-15 DIAGNOSIS — Z9889 Other specified postprocedural states: Secondary | ICD-10-CM

## 2016-07-15 DIAGNOSIS — Z9079 Acquired absence of other genital organ(s): Secondary | ICD-10-CM

## 2016-07-15 DIAGNOSIS — Z90721 Acquired absence of ovaries, unilateral: Secondary | ICD-10-CM

## 2016-07-15 NOTE — Progress Notes (Signed)
GYNECOLOGY  VISIT   HPI: 31 y.o.   Married  Hispanic  female   [phone] with Patient's last menstrual period was 06/22/2016.   here for follow up s/p LAPAROSCOPIC Salpingo-OOPHORECTOMY (Left). 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 She is feeling well. No bowel or bladder c/o. The discomfort she had prior to surgery is gone  GYNECOLOGIC HISTORY: Patient's last menstrual period was 06/22/2016. Contraception:OCP Menopausal hormone therapy: none         OB History    Gravida Para Term Preterm AB Living   2 2 2     2    SAB TAB Ectopic Multiple Live Births           2         There are no active problems to display for this patient.   Past Medical History:  Diagnosis Date  . Ovarian cyst 2016   left     Past Surgical History:  Procedure Laterality Date  . CESAREAN SECTION    . CYSTOSCOPY N/A 06/29/2016   Procedure: CYSTOSCOPY;  Surgeon: Salvadore Dom, MD;  Location: [facility];  Service: Gynecology;  Laterality: N/A;  . KIDNEY STONE SURGERY    . LAPAROSCOPY Left 06/29/2016   Procedure: LAPAROSCOPY OPERATIVE with Left ovarian cystectomy and left paratubal cystectomy;  Surgeon: Salvadore Dom, MD;  Location: [facility];  Service: Gynecology;  Laterality: Left;  left oopherectomy     Current Outpatient Prescriptions  Medication Sig Dispense Refill  . BLISOVI 24 FE 1-20 MG-MCG(24) tablet Take 1 tablet by mouth daily. 3 Package 3  . Cholecalciferol (VITAMIN D PO) Take 1 capsule by mouth daily.    . COLLAGEN PO Take 3 tablets by mouth daily.    Marland Kitchen ibuprofen (ADVIL,MOTRIN) 800 MG tablet Take 1 tablet (800 mg total) by mouth every 8 (eight) hours as needed. 30 tablet 0   No current facility-administered medications for this visit.      ALLERGIES: Patient has no known allergies.  Family History  Problem Relation Age of Onset  . Diabetes Father     Social History   Social History  . Marital status: Married    Spouse name: N/A  . Number of children: N/A  . Years of  education: N/A   Occupational History  . Not on file.   Social History Main Topics  . Smoking status: Never Smoker  . Smokeless tobacco: Never Used  . Alcohol use No  . Drug use: No  . Sexual activity: Yes    Partners: Male    Birth control/ protection: None   Other Topics Concern  . Not on file   Social History Narrative  . No narrative on file    Review of Systems  Constitutional: Negative.   HENT: Negative.   Eyes: Negative.   Respiratory: Negative.   Cardiovascular: Negative.   Gastrointestinal: Negative.   Genitourinary: Negative.   Musculoskeletal: Negative.   Skin: Negative.   Neurological: Negative.   Endo/Heme/Allergies: Negative.   Psychiatric/Behavioral: Negative.     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 Doing  well    PLAN Routine f/u Due for an annual exam in 12/18   An After Visit Summary was printed and given to the patient.

## 2017-04-18 ENCOUNTER — Other Ambulatory Visit: Payer: Self-pay | Admitting: *Deleted

## 2017-04-18 MED ORDER — BLISOVI 24 FE 1-20 MG-MCG(24) PO TABS: 1.0000 | ORAL_TABLET | Freq: Every day | ORAL | 0 refills | Status: DC

## 2017-04-18 NOTE — Telephone Encounter (Signed)
3 packs sent, the patient is due for an annual exam, please schedule

## 2017-04-18 NOTE — Telephone Encounter (Signed)
Medication refill request: OCP 90 day supply Last AEX:  05-26-16  Next AEX: not scheduled (left message to call and schedule)  Last MMG (if hormonal medication request): N/A Refill authorized: please advise

## 2017-07-19 ENCOUNTER — Other Ambulatory Visit: Payer: Self-pay | Admitting: Obstetrics and Gynecology

## 2018-08-28 DIAGNOSIS — J309 Allergic rhinitis, unspecified: Secondary | ICD-10-CM | POA: Diagnosis not present

## 2018-08-28 DIAGNOSIS — R05 Cough: Secondary | ICD-10-CM | POA: Diagnosis not present

## 2018-08-28 DIAGNOSIS — J029 Acute pharyngitis, unspecified: Secondary | ICD-10-CM | POA: Diagnosis not present

## 2021-07-25 ENCOUNTER — Encounter (HOSPITAL_BASED_OUTPATIENT_CLINIC_OR_DEPARTMENT_OTHER): Payer: Self-pay | Admitting: Emergency Medicine

## 2021-07-25 ENCOUNTER — Emergency Department (HOSPITAL_BASED_OUTPATIENT_CLINIC_OR_DEPARTMENT_OTHER): Payer: Self-pay

## 2021-07-25 ENCOUNTER — Other Ambulatory Visit: Payer: Self-pay

## 2021-07-25 ENCOUNTER — Emergency Department (HOSPITAL_BASED_OUTPATIENT_CLINIC_OR_DEPARTMENT_OTHER)
Admission: EM | Admit: 2021-07-25 | Discharge: 2021-07-26 | Disposition: A | Discharge status: Home / Self Care | Payer: Self-pay | Attending: Emergency Medicine | Admitting: Emergency Medicine

## 2021-07-25 DIAGNOSIS — Z20822 Contact with and (suspected) exposure to covid-19: Secondary | ICD-10-CM | POA: Insufficient documentation

## 2021-07-25 DIAGNOSIS — R1084 Generalized abdominal pain: Secondary | ICD-10-CM | POA: Insufficient documentation

## 2021-07-25 DIAGNOSIS — G8918 Other acute postprocedural pain: Secondary | ICD-10-CM | POA: Insufficient documentation

## 2021-07-25 DIAGNOSIS — D649 Anemia, unspecified: Secondary | ICD-10-CM | POA: Insufficient documentation

## 2021-07-25 LAB — COMPREHENSIVE METABOLIC PANEL
ALT: 79 U/L — ABNORMAL HIGH (ref 0–44)
AST: 40 U/L (ref 15–41)
Albumin: 2.9 g/dL — ABNORMAL LOW (ref 3.5–5.0)
Alkaline Phosphatase: 57 U/L (ref 38–126)
Anion gap: 9 (ref 5–15)
BUN: 16 mg/dL (ref 6–20)
CO2: 20 mmol/L — ABNORMAL LOW (ref 22–32)
Calcium: 8.7 mg/dL — ABNORMAL LOW (ref 8.9–10.3)
Chloride: 103 mmol/L (ref 98–111)
Creatinine, Ser: 0.79 mg/dL (ref 0.44–1.00)
GFR, Estimated: >60 mL/min (ref >60)
Glucose, Bld: 88 mg/dL (ref 70–99)
Potassium: 3.8 mmol/L (ref 3.5–5.1)
Sodium: 132 mmol/L — ABNORMAL LOW (ref 135–145)
Total Bilirubin: 0.9 mg/dL (ref 0.3–1.2)
Total Protein: 6.1 g/dL — ABNORMAL LOW (ref 6.5–8.1)

## 2021-07-25 LAB — CBC WITH DIFFERENTIAL/PLATELET
Abs Immature Granulocytes: 0.2 10*3/uL — ABNORMAL HIGH (ref 0.00–0.07)
Basophils Absolute: 0 10*3/uL (ref 0.0–0.1)
Basophils Relative: 0 %
Eosinophils Absolute: 0.4 10*3/uL (ref 0.0–0.5)
Eosinophils Relative: 9 %
HCT: 28.8 % — ABNORMAL LOW (ref 36.0–46.0)
Hemoglobin: 9.6 g/dL — ABNORMAL LOW (ref 12.0–15.0)
Lymphocytes Relative: 24 %
Lymphs Abs: 1 10*3/uL (ref 0.7–4.0)
MCH: 32 pg (ref 26.0–34.0)
MCHC: 33.3 g/dL (ref 30.0–36.0)
MCV: 96 fL (ref 80.0–100.0)
Metamyelocytes Relative: 4 %
Monocytes Absolute: 0 10*3/uL — ABNORMAL LOW (ref 0.1–1.0)
Monocytes Relative: 1 %
Neutro Abs: 2.5 10*3/uL (ref 1.7–7.7)
Neutrophils Relative %: 62 %
Platelets: 167 10*3/uL (ref 150–400)
RBC: 3 MIL/uL — ABNORMAL LOW (ref 3.87–5.11)
RDW: 12.1 % (ref 11.5–15.5)
WBC: 4 10*3/uL (ref 4.0–10.5)
nRBC: 0 % (ref 0.0–0.2)

## 2021-07-25 LAB — RESP PANEL BY RT-PCR (FLU A&B, COVID) ARPGX2
Influenza A by PCR: NEGATIVE
Influenza B by PCR: NEGATIVE
SARS Coronavirus 2 by RT PCR: NEGATIVE

## 2021-07-25 LAB — PREGNANCY, URINE: Preg Test, Ur: NEGATIVE

## 2021-07-25 LAB — URINALYSIS, ROUTINE W REFLEX MICROSCOPIC
Bilirubin Urine: NEGATIVE
Glucose, UA: NEGATIVE mg/dL
Hgb urine dipstick: NEGATIVE
Ketones, ur: 15 mg/dL — AB
Leukocytes,Ua: NEGATIVE
Nitrite: NEGATIVE
Protein, ur: NEGATIVE mg/dL
Specific Gravity, Urine: 1.015 (ref 1.005–1.030)
pH: 6 (ref 5.0–8.0)

## 2021-07-25 LAB — HCG, SERUM, QUALITATIVE: Preg, Serum: NEGATIVE

## 2021-07-25 LAB — LACTIC ACID, PLASMA: Lactic Acid, Venous: 1.8 mmol/L (ref 0.5–1.9)

## 2021-07-25 MED ORDER — SODIUM CHLORIDE 0.9 % IV BOLUS
1000.0000 mL | Freq: Once | INTRAVENOUS | Status: AC
  Administered 2021-07-25: 1000 mL via INTRAVENOUS

## 2021-07-25 MED ORDER — IOHEXOL 300 MG/ML  SOLN
100.0000 mL | Freq: Once | INTRAMUSCULAR | Status: AC | PRN
  Administered 2021-07-25: 100 mL via INTRAVENOUS

## 2021-07-25 MED ORDER — ONDANSETRON HCL 4 MG/2ML IJ SOLN
4.0000 mg | Freq: Once | INTRAMUSCULAR | Status: AC
  Administered 2021-07-25: 4 mg via INTRAVENOUS
  Filled 2021-07-25: qty 2

## 2021-07-25 MED ORDER — MORPHINE SULFATE (PF) 4 MG/ML IV SOLN: 4.0000 mg | Freq: Once | INTRAVENOUS | Status: DC

## 2021-07-25 NOTE — ED Triage Notes (Signed)
Reports having a tummy tuck three weeks ago.  Was feeling better but for the last three days has had increased pain and reports the wound has opened up some with drainage.  Also reports nausea and headache as well. Taking excedrin with no relief.

## 2021-07-25 NOTE — ED Notes (Signed)
Patient transported to CT 

## 2021-07-25 NOTE — ED Provider Notes (Signed)
[facility] Provider Note   CSN: [id number] Arrival date & time: 07/25/21  2038     History  Chief Complaint  Patient presents with   Post-op Problem    Rita Stevens is a 36 y.o. female.  36 year old female presents for evaluation of abdominal pain and bleeding from her surgical site.  Patient states that she is 3 weeks post tummy tuck, has noticed pain along her incision site and has been feeling generally unwell with headache recently.  Tonight, noticed small area of bleeding from the far left side of her incision site which prompted her to come to the emergency room.  Denies fevers, body aches, dysuria or changes in bowel habits.  Surgery was sent in a [facility] spa in [place].  No significant past medical history otherwise.  No other complaints or concerns      Home Medications Prior to Admission medications   Medication Sig Start Date End Date Taking? Authorizing Provider  BLISOVI 24 FE 1-20 MG-MCG(24) tablet Take 1 tablet daily by mouth. 04/18/17   Salvadore Dom, MD  Cholecalciferol (VITAMIN D PO) Take 1 capsule by mouth daily.    [provider]  COLLAGEN PO Take 3 tablets by mouth daily.    [provider]  ibuprofen (ADVIL,MOTRIN) 800 MG tablet Take 1 tablet (800 mg total) by mouth every 8 (eight) hours as needed. 06/29/16   Salvadore Dom, MD      Allergies    Patient has no known allergies.    Review of Systems   Review of Systems Negative except as per HPI Physical Exam Updated Vital Signs BP 93/67    Pulse 91    Temp 98.4 F (36.9 C) (Oral)    Resp 18    Wt 81.6 kg    LMP 06/30/2021    SpO2 100%    BMI 28.25 kg/m  Physical Exam Vitals and nursing note reviewed.  Constitutional:      General: She is not in acute distress.    Appearance: She is well-developed. She is not diaphoretic.  HENT:     Head: Normocephalic and atraumatic.  Eyes:     Conjunctiva/sclera: Conjunctivae normal.  Cardiovascular:      Rate and Rhythm: Normal rate and regular rhythm.     Heart sounds: Normal heart sounds.  Pulmonary:     Effort: Pulmonary effort is normal.     Breath sounds: Normal breath sounds.  Abdominal:     Palpations: Abdomen is soft.     Tenderness: There is abdominal tenderness.     Comments: Mild erythema along lower abdominal incision with induration more towards the midline to left portion. Pinpoint area of bleeding from far left side of incision.   Musculoskeletal:     Cervical back: Neck supple.  Skin:    General: Skin is warm and dry.     Findings: Erythema present.  Neurological:     Mental Status: She is alert and oriented to person, place, and time.  Psychiatric:        Behavior: Behavior normal.    ED Results / Procedures / Treatments   Labs (all labs ordered are listed, but only abnormal results are displayed) Labs Reviewed  COMPREHENSIVE METABOLIC PANEL - Abnormal; Notable for the following components:      Result Value   Sodium 132 (*)    CO2 20 (*)    Calcium 8.7 (*)    Total Protein 6.1 (*)    Albumin 2.9 (*)  ALT 79 (*)    All other components within normal limits  CBC WITH DIFFERENTIAL/PLATELET - Abnormal; Notable for the following components:   RBC 3.00 (*)    Hemoglobin 9.6 (*)    HCT 28.8 (*)    Monocytes Absolute 0.0 (*)    Abs Immature Granulocytes 0.20 (*)    All other components within normal limits  RESP PANEL BY RT-PCR (FLU A&B, COVID) [id number]  LACTIC ACID, PLASMA  LACTIC ACID, PLASMA  URINALYSIS, ROUTINE W REFLEX MICROSCOPIC  PREGNANCY, URINE  HCG, SERUM, QUALITATIVE    EKG None  Radiology No results found.  Procedures Procedures    Medications Ordered in ED Medications  morphine (PF) 4 MG/ML injection 4 mg (0 mg Intravenous Hold 07/25/21 2202)  sodium chloride 0.9 % bolus 1,000 mL (0 mLs Intravenous Stopped 07/25/21 2217)  ondansetron (ZOFRAN) injection 4 mg (4 mg Intravenous Given 07/25/21 2121)  sodium chloride 0.9 % bolus 1,000  mL (1,000 mLs Intravenous New Bag/Given 07/25/21 2159)    ED Course/ Medical Decision Making/ A&P                           Medical Decision Making Amount and/or Complexity of Data Reviewed Labs: ordered. Radiology: ordered.  Risk Prescription drug management.   36 year old female presents with complaint of pain along her incision site with bleeding to the far left side of her incision after a tummy tuck 3 weeks ago.  Feels like she has fluid sloshing around in that area.  Also states she feels like she is pale in appearance today and has a little bit of a headache.  Denies fevers, cough or other URI symptoms.  Denies changes in bowel or bladder habits. On exam, patient has a large lower abdominal incision which is mildly erythematous, somewhat indurated towards the left side with a pinpoint area with very slow active bleeding which is controlled with pressure. Patient's blood pressure is soft on arrival at 101/60, she remains in the 90s over 50s to 60s range despite 2 L of normal saline.  Blood bank not available at this facility.  Patient is found to be anemic with hemoglobin of 9.5 however no recent hemoglobin to compare to.  She is not tachycardic and is maintaining O2 sats of 100% on room air. Plan is to obtain CT abdomen pelvis for further evaluation, radiology is pending pregnancy test which is in the works. Care is signed out to Dr. Lanny Cramp at change of shift pending imaging and disposition.        Final Clinical Impression(s) / ED Diagnoses Final diagnoses:  Post-operative pain  Generalized abdominal pain  Anemia, unspecified type    Rx / DC Orders ED Discharge Orders     None         Tacy Learn, PA-C 07/25/21 2313    Veryl Speak, MD 07/26/21 [phone]

## 2021-07-25 NOTE — ED Notes (Signed)
Pt up to bathroom with CT. Some bloody drainage noted on gown and pt. Loose dressing applied and gown changed.

## 2021-07-26 LAB — HEMOGLOBIN AND HEMATOCRIT, BLOOD
HCT: 26.1 % — ABNORMAL LOW (ref 36.0–46.0)
HCT: 28.6 % — ABNORMAL LOW (ref 36.0–46.0)
Hemoglobin: 8.8 g/dL — ABNORMAL LOW (ref 12.0–15.0)
Hemoglobin: 9.4 g/dL — ABNORMAL LOW (ref 12.0–15.0)

## 2021-07-26 MED ORDER — FENTANYL CITRATE PF 50 MCG/ML IJ SOSY
75.0000 ug | PREFILLED_SYRINGE | Freq: Once | INTRAMUSCULAR | Status: AC
  Administered 2021-07-26: 75 ug via INTRAVENOUS
  Filled 2021-07-26: qty 2

## 2021-07-26 MED ORDER — HYDROMORPHONE HCL 1 MG/ML IJ SOLN
0.5000 mg | Freq: Once | INTRAMUSCULAR | Status: AC
  Administered 2021-07-26: 0.5 mg via INTRAVENOUS
  Filled 2021-07-26: qty 1

## 2021-07-26 NOTE — ED Notes (Signed)
Report given to Wallace Keller at [facility] ED.  Report also given to [facility].

## 2021-07-26 NOTE — ED Notes (Signed)
Call placed to [facility] again, spoke with Vicente Males. Surgeon did not answer direct call this time.

## 2021-07-26 NOTE — ED Notes (Signed)
Leaving with [facility] at this time. 

## 2021-07-26 NOTE — ED Notes (Signed)
Call placed again to [facility], spoke with Vicente Males. MD called again with no answer.

## 2021-07-26 NOTE — ED Notes (Signed)
Placed call to [facility],  spoke with Vicente Males. Anna called oncall doctor at that time and I transferred to Dr. Stark Jock.

## 2021-12-08 ENCOUNTER — Ambulatory Visit (HOSPITAL_BASED_OUTPATIENT_CLINIC_OR_DEPARTMENT_OTHER): Payer: Self-pay | Admitting: Family Medicine

## 2022-03-18 ENCOUNTER — Ambulatory Visit (INDEPENDENT_AMBULATORY_CARE_PROVIDER_SITE_OTHER): Payer: Self-pay | Admitting: Internal Medicine

## 2022-03-18 ENCOUNTER — Encounter (INDEPENDENT_AMBULATORY_CARE_PROVIDER_SITE_OTHER): Payer: Self-pay | Admitting: Internal Medicine

## 2022-03-18 VITALS — BP 115/7 | HR 63 | Temp 98.7°F | Ht 65.0 in | Wt 215.0 lb

## 2022-03-18 DIAGNOSIS — Z0289 Encounter for other administrative examinations: Secondary | ICD-10-CM

## 2022-03-18 DIAGNOSIS — E669 Obesity, unspecified: Secondary | ICD-10-CM

## 2022-03-18 DIAGNOSIS — Z6835 Body mass index (BMI) 35.0-35.9, adult: Secondary | ICD-10-CM

## 2022-03-18 NOTE — Progress Notes (Signed)
Office: [phone]  /  Fax: [phone]  Initial Visit  Rita Stevens was seen in clinic today to evaluate for obesity. She is interested in losing weight to improve overall health and reduce the risk of weight related complications.  She reports having problems with her weight since her teenage years.  She denies family members with obesity.  She has tried few nutritional plans in the past and also phentermine in the past with some success.  Had lost 25 to 40 pounds over a period of 8 months.  Her highest weight is her current weight at 215.  Her other less than weight was 140 pounds.  She notes gaining weight with both of her pregnancies she has 2 boys.  She is currently following a particular nutritional plan and although considers herself active does not doing regular exercise.  She also acknowledges at times controlling urges to eat unhealthy food and also controlling food portions.  She at times feels guilty about her eating.  She denies any complications associated with her weight.  She presents today to review program treatment options, initial physical assessment, and evaluation.      Past medical history includes:   Past Medical History:  Diagnosis Date   Ovarian cyst 2016   left      Objective:   BP (!) 115/7   Pulse 63   Temp 98.7 F (37.1 C)   Ht '5\' 5"'$  (1.651 m)   Wt 215 lb (97.5 kg)   SpO2 100%   BMI 35.78 kg/m  She was weighed on the bioimpedance scale:  Body mass index is 35.78 kg/m.  General:  Alert, oriented and cooperative. Patient is in no acute distress.  Respiratory: Normal respiratory effort, no problems with respiration noted  Extremities: Normal range of motion.    Mental Status: Normal mood and affect. Normal behavior. Normal judgment and thought content.   Assessment and Plan:  1. Class 2 obesity without serious comorbidity with body mass index (BMI) of 35.0 to 35.9 in adult, unspecified obesity type  Patient counseled on weight and health  risks.  I reviewed most recent blood work from February 2023.  She had an elevated ALT.  There is no recent lipid or labs focused on glycemic control.  Contributing factors include nutritional, decreased physical activity as well as status post 2 pregnancies.  Had been on phentermine in the past without side effects and with good results.  Would benefit from evaluation of R EE and creation of a reduced calorie high-protein meal plan to initiate the fat loss process.  We also need to do baseline labs to assess for cardiometabolic risks.  Counseled on the need to create a calorie deficit and also ensure increase protein intake.  We reviewed contributors to total daily energy expenditure and causes of disrupters of energy regulation system.       Obesity Treatment Plan:  She will work on garnering support from family and friends to begin weight loss journey. Work on eliminating or reducing the presence of highly processed, calorie dense foods in the home. Complete provided nutritional and psychosocial assessment questionnaire.  Assess for cardiometabolic complications and nutritional deficiencies via serologies. Assess REE via indirect calorimetry at next office visit to guide the creation of a reduced calorie, high protein meal plan to promote loss of fat mass.    Dannelle Rhymes will follow up in the next 1-2 weeks to review the above steps and continue evaluation and treatment.  Obesity Education Performed Today:  She  was weighed on the bioimpedance scale and results were discussed and documented in the synopsis.  We discussed obesity as a disease and the importance of a more detailed evaluation of all the factors contributing to the disease.  We discussed the importance of long term lifestyle changes which include nutrition, exercise and behavioral modifications as well as the importance of customizing this to her specific health and social needs.  We discussed the benefits of reaching a  healthier weight to alleviate the symptoms of existing conditions and reduce the risks of the biomechanical, metabolic and psychological effects of obesity.  Yecenia Dalgleish appears to be in the action stage of change and states they are ready to start intensive lifestyle modifications and behavioral modifications.  25 minutes was spent today on this visit including the above counseling, pre-visit chart review, and post-visit documentation.  Thomes Dinning, MD

## 2022-03-25 ENCOUNTER — Encounter (INDEPENDENT_AMBULATORY_CARE_PROVIDER_SITE_OTHER): Payer: Self-pay | Admitting: Internal Medicine

## 2022-03-25 ENCOUNTER — Ambulatory Visit (INDEPENDENT_AMBULATORY_CARE_PROVIDER_SITE_OTHER): Payer: Self-pay | Admitting: Internal Medicine

## 2022-03-25 VITALS — BP 104/70 | HR 68 | Temp 98.6°F | Ht 65.0 in | Wt 215.8 lb

## 2022-03-25 DIAGNOSIS — D508 Other iron deficiency anemias: Secondary | ICD-10-CM

## 2022-03-25 DIAGNOSIS — R5383 Other fatigue: Secondary | ICD-10-CM

## 2022-03-25 DIAGNOSIS — E669 Obesity, unspecified: Secondary | ICD-10-CM

## 2022-03-25 DIAGNOSIS — R0602 Shortness of breath: Secondary | ICD-10-CM | POA: Insufficient documentation

## 2022-03-25 DIAGNOSIS — D649 Anemia, unspecified: Secondary | ICD-10-CM | POA: Insufficient documentation

## 2022-03-25 DIAGNOSIS — Z6835 Body mass index (BMI) 35.0-35.9, adult: Secondary | ICD-10-CM

## 2022-03-25 DIAGNOSIS — Z1331 Encounter for screening for depression: Secondary | ICD-10-CM

## 2022-03-25 DIAGNOSIS — M25551 Pain in right hip: Secondary | ICD-10-CM | POA: Insufficient documentation

## 2022-03-25 HISTORY — DX: Shortness of breath: R06.02

## 2022-03-25 HISTORY — DX: Pain in right hip: M25.551

## 2022-03-25 HISTORY — DX: Other fatigue: R53.83

## 2022-03-26 LAB — INSULIN, RANDOM: INSULIN: 26 u[IU]/mL — ABNORMAL HIGH (ref 2.6–24.9)

## 2022-03-26 LAB — LIPID PANEL WITH LDL/HDL RATIO
Cholesterol, Total: 231 mg/dL — ABNORMAL HIGH (ref 100–199)
HDL: 45 mg/dL (ref >39)
LDL Chol Calc (NIH): 160 mg/dL — ABNORMAL HIGH (ref 0–99)
LDL/HDL Ratio: 3.6 ratio — ABNORMAL HIGH (ref 0.0–3.2)
Triglycerides: 145 mg/dL (ref 0–149)
VLDL Cholesterol Cal: 26 mg/dL (ref 5–40)

## 2022-03-31 NOTE — Progress Notes (Signed)
Chief Complaint:   OBESITY Rita Stevens (MR# [id number]) is a 36 y.o. female who presents for evaluation and treatment of obesity and related comorbidities. Current BMI is Body mass index is 35.91 kg/m. Rita Stevens has been struggling with her weight for many years and has been unsuccessful in either losing weight, maintaining weight loss, or reaching her healthy weight goal.  Rita Stevens reports at present time she feels that the meal plan would be difficult to incorporate due to her husband and children eating habits.  They also eat out several times a week.  There was difficulty understanding macronutrient sources and calorie content of food.  Rita Stevens is currently in the action stage of change and ready to dedicate time achieving and maintaining a healthier weight. Rita Stevens is interested in becoming our patient and working on intensive lifestyle modifications including (but not limited to) diet and exercise for weight loss.  Rita Stevens's habits were reviewed today and are as follows: Her family eats meals together, she thinks her family will eat healthier with her, her desired weight loss is 55 lbs, she has been heavy most of her life, she started gaining weight after childbirth, her heaviest weight ever was 215 pounds, she skips meals frequently, she is frequently drinking liquids with calories, she frequently makes poor food choices, she has problems with excessive hunger, she frequently eats larger portions than normal, and she struggles with emotional eating.  Depression Screen Rita Stevens's Food and Mood (modified PHQ-9) score was 15.     03/25/2022    9:44 AM  Depression screen PHQ 2/9  Decreased Interest 3  Down, Depressed, Hopeless 3  PHQ - 2 Score 6  Altered sleeping 3  Tired, decreased energy 2  Change in appetite 1  Feeling bad or failure about yourself  1  Trouble concentrating 1  Moving slowly or fidgety/restless 1  Suicidal thoughts 0  PHQ-9 Score 15  Difficult doing  work/chores Somewhat difficult   Subjective:   1. Other fatigue Rita Stevens admits to daytime somnolence and denies waking up still tired. Patient has a history of symptoms of daytime fatigue. Rita Stevens generally gets 6 hours of sleep per night, and states that she has generally restful sleep. Snoring is not present. Apneic episodes are not present. Epworth Sleepiness Score is 3.   2. SOB (shortness of breath) Rita Stevens notes increasing shortness of breath with exercising and seems to be worsening over time with weight gain. She notes getting out of breath sooner with activity than she used to. This has not gotten worse recently. Rita Stevens denies shortness of breath at rest or orthopnea.  3. Other iron deficiency anemia Rita Stevens's last hemoglobin was 8.9 in February 2023.  It was acute blood loss from surgery.  No signs of active bleeding.  I discussed labs with the patient today.  4. 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  This appears to be musculoskeletal.  Assessment/Plan:   1. Other fatigue Rita Stevens does feel that her weight is causing her energy to be lower than it should be. Fatigue may be related to obesity, depression or many other causes. Labs will be ordered, and in the meanwhile, Rita Stevens focus on self care including making healthy food choices, increasing physical activity and focusing on stress reduction.  - EKG 12-Lead - Comprehensive metabolic panel; Future - Hemoglobin A1c; Future - Insulin, random - VITAMIN D 25 Hydroxy (Vit-D Deficiency, Fractures); Future - TSH; Future - Lipid Panel With LDL/HDL Ratio - Vitamin B12  2. SOB (shortness of breath) Rita Stevens  does feel that she gets out of breath more easily that she used to when she exercises. Rita Stevens's shortness of breath appears to be obesity related and exercise induced. She has agreed to work on weight loss and gradually increase exercise to treat her exercise induced shortness of breath. Will continue to monitor  closely.  3. Other iron deficiency anemia We will check labs today, and we will follow-up at [place] next office visit.  - CBC with Differential/Platelet; Future  4. Right hip pain Rita Stevens is to schedule an appointment with her PCP for further evaluation.  May take Aleve over-the-counter twice a day for 5 days.  5. Depression screening Rita Stevens had a positive depression screening. Depression is commonly associated with obesity and often results in emotional eating behaviors. We will monitor this closely and work on CBT to help improve the non-hunger eating patterns. Referral to Psychology may be required if no improvement is seen as she continues in our clinic.  6. Class 2 obesity without serious comorbidity with body mass index (BMI) of 35.0 to 35.9 in adult, unspecified obesity type Rita Stevens is currently in the action stage of change and her goal is to continue with weight loss efforts. I recommend Rita Stevens begin the structured treatment plan as follows:  She has agreed to practicing portion control and making smarter food choices, such as increasing vegetables and decreasing simple carbohydrates.  Displayed some difficulty understanding meal plan and the importance of making healthy substitutions and eating out less.  She is concerned about food waste associated with her family who may not be interested in eating vegetables.  Her kids are also used to eating out often.  She would benefit from additional counseling regarding food labels and macronutrients.  Exercise goals: No exercise has been prescribed at this time.   Behavioral modification strategies: increasing lean protein intake, decreasing simple carbohydrates, increasing vegetables, increasing water intake, decreasing liquid calories, decreasing eating out, meal planning and cooking strategies, better snacking choices, avoiding temptations, and planning for success.  She was informed of the importance of frequent follow-up visits  to maximize her success with intensive lifestyle modifications for her multiple health conditions. She was informed we would discuss her lab results at her next visit unless there is a critical issue that needs to be addressed sooner. Rita Stevens agreed to keep her next visit at the agreed upon time to discuss these results.  Objective:   Blood pressure 104/70, pulse 68, temperature 98.6 F (37 C), height '5\' 5"'$  (1.651 m), weight 215 lb 12.8 oz (97.9 kg), SpO2 97 %. Body mass index is 35.91 kg/m.  EKG: Normal sinus rhythm, rate 73 BPM.  Indirect Calorimeter completed today shows a VO2 of 225 and a REE of 1555.  Her calculated basal metabolic rate is 5885 thus her basal metabolic rate is worse than expected.  General: Cooperative, alert, well developed, in no acute distress. HEENT: Conjunctivae and lids unremarkable. Cardiovascular: Regular rhythm.  Lungs: Normal work of breathing. Neurologic: No focal deficits.   Lab Results  Component Value Date   CREATININE 0.79 07/25/2021   BUN 16 07/25/2021   NA 132 (L) 07/25/2021   K 3.8 07/25/2021   CL 103 07/25/2021   CO2 20 (L) 07/25/2021   Lab Results  Component Value Date   ALT 79 (H) 07/25/2021   AST 40 07/25/2021   ALKPHOS 57 07/25/2021   BILITOT 0.9 07/25/2021   No results found for: "HGBA1C" Lab Results  Component Value Date   INSULIN 26.0 (H)  03/25/2022   Lab Results  Component Value Date   TSH 1.32 05/26/2016   Lab Results  Component Value Date   CHOL 231 (H) 03/25/2022   HDL 45 03/25/2022   LDLCALC 160 (H) 03/25/2022   TRIG 145 03/25/2022   CHOLHDL 3.7 06/10/2016   Lab Results  Component Value Date   WBC 4.0 07/25/2021   HGB 9.4 (L) 07/26/2021   HCT 28.6 (L) 07/26/2021   MCV 96.0 07/25/2021   PLT 167 07/25/2021   No results found for: "IRON", "TIBC", "FERRITIN"  Attestation Statements:   Reviewed by clinician on day of visit: allergies, medications, problem list, medical history, surgical history, family  history, social history, and previous encounter notes.  Time spent on visit including pre-visit chart review and post-visit charting and care was 40 minutes.   Wilhemena Durie, am acting as transcriptionist for Thomes Dinning, MD.  I have reviewed the above documentation for accuracy and completeness, and I agree with the above. -Thomes Dinning, MD

## 2022-04-08 ENCOUNTER — Encounter (INDEPENDENT_AMBULATORY_CARE_PROVIDER_SITE_OTHER): Payer: Self-pay | Admitting: Internal Medicine

## 2022-04-08 ENCOUNTER — Ambulatory Visit (INDEPENDENT_AMBULATORY_CARE_PROVIDER_SITE_OTHER): Payer: Self-pay | Admitting: Internal Medicine

## 2022-04-08 VITALS — BP 118/81 | HR 65 | Temp 98.2°F | Ht 65.0 in | Wt 215.6 lb

## 2022-04-08 DIAGNOSIS — E7849 Other hyperlipidemia: Secondary | ICD-10-CM

## 2022-04-08 DIAGNOSIS — E782 Mixed hyperlipidemia: Secondary | ICD-10-CM | POA: Insufficient documentation

## 2022-04-08 DIAGNOSIS — E669 Obesity, unspecified: Secondary | ICD-10-CM

## 2022-04-08 DIAGNOSIS — Z6835 Body mass index (BMI) 35.0-35.9, adult: Secondary | ICD-10-CM

## 2022-04-08 DIAGNOSIS — E88819 Insulin resistance, unspecified: Secondary | ICD-10-CM | POA: Insufficient documentation

## 2022-04-08 DIAGNOSIS — D508 Other iron deficiency anemias: Secondary | ICD-10-CM

## 2022-04-08 DIAGNOSIS — R632 Polyphagia: Secondary | ICD-10-CM

## 2022-04-08 MED ORDER — BUPROPION HCL ER (SR) 100 MG PO TB12
100.0000 mg | ORAL_TABLET | Freq: Every day | ORAL | 0 refills | Status: DC
  Filled 2022-04-14: qty 30, 30d supply, fill #0

## 2022-04-08 MED ORDER — BUPROPION HCL ER (SR) 100 MG PO TB12: 100.0000 mg | ORAL_TABLET | Freq: Every day | ORAL | 0 refills | Status: DC

## 2022-04-09 LAB — CMP14+EGFR
ALT: 19 IU/L (ref 0–32)
AST: 22 IU/L (ref 0–40)
Albumin/Globulin Ratio: 1.8 (ref 1.2–2.2)
Albumin: 4.6 g/dL (ref 3.9–4.9)
Alkaline Phosphatase: 41 IU/L — ABNORMAL LOW (ref 44–121)
BUN/Creatinine Ratio: 18 (ref 9–23)
BUN: 12 mg/dL (ref 6–20)
Bilirubin Total: 0.4 mg/dL (ref 0.0–1.2)
CO2: 23 mmol/L (ref 20–29)
Calcium: 9.9 mg/dL (ref 8.7–10.2)
Chloride: 103 mmol/L (ref 96–106)
Creatinine, Ser: 0.67 mg/dL (ref 0.57–1.00)
Globulin, Total: 2.6 g/dL (ref 1.5–4.5)
Glucose: 92 mg/dL (ref 70–99)
Potassium: 4 mmol/L (ref 3.5–5.2)
Sodium: 138 mmol/L (ref 134–144)
Total Protein: 7.2 g/dL (ref 6.0–8.5)
eGFR: 115 mL/min/{1.73_m2} (ref >59)

## 2022-04-09 LAB — CBC WITH DIFFERENTIAL
Basophils Absolute: 0 10*3/uL (ref 0.0–0.2)
Basos: 0 %
EOS (ABSOLUTE): 0.1 10*3/uL (ref 0.0–0.4)
Eos: 3 %
Hematocrit: 41.6 % (ref 34.0–46.6)
Hemoglobin: 14 g/dL (ref 11.1–15.9)
Immature Grans (Abs): 0 10*3/uL (ref 0.0–0.1)
Immature Granulocytes: 0 %
Lymphocytes Absolute: 1.5 10*3/uL (ref 0.7–3.1)
Lymphs: 32 %
MCH: 31 pg (ref 26.6–33.0)
MCHC: 33.7 g/dL (ref 31.5–35.7)
MCV: 92 fL (ref 79–97)
Monocytes Absolute: 0.5 10*3/uL (ref 0.1–0.9)
Monocytes: 10 %
Neutrophils Absolute: 2.5 10*3/uL (ref 1.4–7.0)
Neutrophils: 55 %
RBC: 4.51 x10E6/uL (ref 3.77–5.28)
RDW: 12.8 % (ref 11.7–15.4)
WBC: 4.7 10*3/uL (ref 3.4–10.8)

## 2022-04-09 LAB — TSH: TSH: 1.23 u[IU]/mL (ref 0.450–4.500)

## 2022-04-09 LAB — HEMOGLOBIN A1C
Est. average glucose Bld gHb Est-mCnc: 108 mg/dL
Hgb A1c MFr Bld: 5.4 % (ref 4.8–5.6)

## 2022-04-09 LAB — VITAMIN D 25 HYDROXY (VIT D DEFICIENCY, FRACTURES): Vit D, 25-Hydroxy: 25.5 ng/mL — ABNORMAL LOW (ref 30.0–100.0)

## 2022-04-14 ENCOUNTER — Other Ambulatory Visit (HOSPITAL_BASED_OUTPATIENT_CLINIC_OR_DEPARTMENT_OTHER): Payer: Self-pay

## 2022-04-21 NOTE — Progress Notes (Signed)
Chief Complaint:   OBESITY Rita Stevens is here to discuss her progress with her obesity treatment plan along with follow-up of her obesity related diagnoses. Rita Stevens is on practicing portion control and making smarter food choices, such as increasing vegetables and decreasing simple carbohydrates and states she is following her eating plan approximately 0% of the time. Rita Stevens states she is doing 0 minutes 0 times per week.  Today's visit was #: 2 Starting weight: 215 lbs Starting date: 03/25/2022 Today's weight: 215 lbs Today's date: 04/08/2022 Total lbs lost to date: 0 Total lbs lost since last in-office visit: 0  Interim History: Rita Stevens actually has implemented plan.  She has also eliminated sugary drinks, eating out, and she has been preparing meals at home.  Her family is supportive.  She is frustrated with lack of weight loss.  She admits to cravings and hunger signals.  She has noticed an improvement in energy, sleep, and sense of wellbeing.  She had been on phentermine in the past with good results but she noted that weight came back after stopping medication.  Subjective:   1. Other hyperlipidemia Rita Stevens's last LDL was 160, and she was counseled on the risks.   2. Insulin resistance Rita Stevens's last insulin level was elevated with polyphagia.   3. Other iron deficiency anemia Rita Stevens's fatigue has improved.   4. Polyphagia Rita Stevens notes polyphagia due to elevated insulin level and emotional eating behavior.  Assessment/Plan:   1. Other hyperlipidemia We will check labs today. Rita Stevens continue with her dietary changes.  She is eating takeout a lot less.  - Hemoglobin A1c - VITAMIN D 25 Hydroxy (Vit-D Deficiency, Fractures) - CMP14+EGFR - TSH  2. Insulin resistance We will check labs today. Rita Stevens continue with her nutritional changes.  - Hemoglobin A1c  3. Other iron deficiency anemia We will check labs today, as this was not completed at her last visit.    - CBC With Differential  4. Polyphagia After a discussion of benefits and side effects and shared decision making, Rita Stevens will start bupropion SR 100 mg once daily in the morning, with no refills.  - buPROPion ER (WELLBUTRIN SR) 100 MG 12 hr tablet; Take 1 tablet (100 mg total) by mouth daily.  Dispense: 30 tablet; Refill: 0  5. Obesity with current BMI 35.9 Rita Stevens is currently in the action stage of change. As such, her goal is to continue with weight loss efforts. She has agreed to practicing portion control and making smarter food choices, such as increasing vegetables and decreasing simple carbohydrates.   Exercise goals: No exercise has been prescribed at this time.  Behavioral modification strategies: increasing lean protein intake, increasing water intake, meal planning and cooking strategies, keeping healthy foods in the home, dealing with family or coworker sabotage, avoiding temptations, and planning for success.  Rita Stevens has agreed to follow-up with our clinic in 2 weeks. She was informed of the importance of frequent follow-up visits to maximize her success with intensive lifestyle modifications for her multiple health conditions.   Rita Stevens was informed we would discuss her lab results at her next visit unless there is a critical issue that needs to be addressed sooner. Rita Stevens agreed to keep her next visit at the agreed upon time to discuss these results.  Objective:   Blood pressure 118/81, pulse 65, temperature 98.2 F (36.8 C), height _0  (1.651 m), weight 215 lb 9.6 oz (97.8 kg), SpO2 99 %. Body mass index is 35.88 kg/m.  General: Cooperative, alert,  well developed, in no acute distress. HEENT: Conjunctivae and lids unremarkable. Cardiovascular: Regular rhythm.  Lungs: Normal work of breathing. Neurologic: No focal deficits.   Lab Results  Component Value Date   CREATININE 0.67 04/08/2022   BUN 12 04/08/2022   NA 138 04/08/2022   K 4.0 04/08/2022   CL 103  04/08/2022   CO2 23 04/08/2022   Lab Results  Component Value Date   ALT 19 04/08/2022   AST 22 04/08/2022   ALKPHOS 41 (L) 04/08/2022   BILITOT 0.4 04/08/2022   Lab Results  Component Value Date   HGBA1C 5.4 04/08/2022   Lab Results  Component Value Date   INSULIN 26.0 (H) 03/25/2022   Lab Results  Component Value Date   TSH 1.230 04/08/2022   Lab Results  Component Value Date   CHOL 231 (H) 03/25/2022   HDL 45 03/25/2022   LDLCALC 160 (H) 03/25/2022   TRIG 145 03/25/2022   CHOLHDL 3.7 06/10/2016   Lab Results  Component Value Date   VD25OH 25.5 (L) 04/08/2022   VD25OH 28 (L) 05/26/2016   Lab Results  Component Value Date   WBC 4.7 04/08/2022   HGB 14.0 04/08/2022   HCT 41.6 04/08/2022   MCV 92 04/08/2022   PLT 167 07/25/2021   No results found for: "IRON", "TIBC", "FERRITIN"  Attestation Statements:   Reviewed by clinician on day of visit: allergies, medications, problem list, medical history, surgical history, family history, social history, and previous encounter notes.  Time spent on visit including pre-visit chart review and post-visit care and charting was 20 minutes.   Wilhemena Durie, am acting as transcriptionist for Thomes Dinning, MD.  I have reviewed the above documentation for accuracy and completeness, and I agree with the above. -Thomes Dinning, MD

## 2022-04-29 ENCOUNTER — Other Ambulatory Visit (HOSPITAL_BASED_OUTPATIENT_CLINIC_OR_DEPARTMENT_OTHER): Payer: Self-pay

## 2022-04-29 ENCOUNTER — Ambulatory Visit (INDEPENDENT_AMBULATORY_CARE_PROVIDER_SITE_OTHER): Payer: Self-pay | Admitting: Internal Medicine

## 2022-04-29 VITALS — BP 111/76 | HR 69 | Temp 98.3°F | Ht 65.0 in | Wt 214.0 lb

## 2022-04-29 DIAGNOSIS — E669 Obesity, unspecified: Secondary | ICD-10-CM

## 2022-04-29 DIAGNOSIS — R632 Polyphagia: Secondary | ICD-10-CM

## 2022-04-29 DIAGNOSIS — Z6835 Body mass index (BMI) 35.0-35.9, adult: Secondary | ICD-10-CM

## 2022-04-29 MED ORDER — BUPROPION HCL ER (SR) 100 MG PO TB12: 100.0000 mg | ORAL_TABLET | Freq: Two times a day (BID) | ORAL | 0 refills | Status: DC

## 2022-04-29 MED ORDER — METFORMIN HCL ER 500 MG PO TB24
500.0000 mg | ORAL_TABLET | Freq: Every day | ORAL | 0 refills | Status: DC
  Filled 2022-04-29: qty 30, 30d supply, fill #0

## 2022-05-04 ENCOUNTER — Other Ambulatory Visit (HOSPITAL_BASED_OUTPATIENT_CLINIC_OR_DEPARTMENT_OTHER): Payer: Self-pay

## 2022-05-16 NOTE — Progress Notes (Unsigned)
Chief Complaint:   OBESITY Rita Stevens is here to discuss her progress with her obesity treatment plan along with follow-up of her obesity related diagnoses. [name] is on practicing portion control Rita making smarter food choices, such as increasing vegetables Rita decreasing simple carbohydrates Rita states she is following her eating plan approximately ?% of the time. [name] states she is not currently exercising.  Today's visit was #: 3 Starting weight: 215 lbs Starting date: 03/25/2022 Today's weight: 214 lbs Today's date: 04/29/2022 Total lbs lost to date: 1 Total lbs lost since last in-office visit: 1  Interim History: Rita Stevens presents for follow up. She has been eating more salad with protein Rita no longer eating out. Rita Stevens inquires about recipes Rita how to approach Thanksgiving dinner. Bupropion has helped with cravings but notes carb cravings in the evening that are, at times, difficult to control.  Subjective:   1. Polyphagia Symptoms are emotional Rita Rita Stevens is also insulin resistant.  Assessment/Plan:   1. Polyphagia Start Metformin XR 500 mg once daily. Reviewed medication side effects.  Refill- buPROPion ER (WELLBUTRIN SR) 100 MG 12 hr tablet; Take 1 tablet (100 mg total) by mouth 2 (two) times daily.  Dispense: 60 tablet; Refill: 0 Start- metFORMIN (GLUCOPHAGE-XR) 500 MG 24 hr tablet; Take 1 tablet (500 mg total) by mouth daily with breakfast.  Dispense: 30 tablet; Refill: 0  2. Obesity with current BMI 35.7 [name] is currently in the action stage of change. As such, her goal is to continue with weight loss efforts. She has agreed to practicing portion control Rita making smarter food choices, such as increasing vegetables Rita decreasing simple carbohydrates.   Increase bupropion to twice a day. Start Metformin 500 mg with breakfast.  Exercise goals: No exercise has been prescribed at this time.  Behavioral modification strategies: increasing lean protein intake, meal  planning Rita cooking strategies, holiday eating strategies , celebration eating strategies, avoiding temptations, Rita planning for success.  [name] has agreed to follow-up with our clinic in 4 weeks. She was informed of the importance of frequent follow-up visits to maximize her success with intensive lifestyle modifications for her multiple health conditions.   Objective:   Blood pressure 111/76, pulse 69, temperature 98.3 F (36.8 C), height '5\' 5"'$  (1.651 m), SpO2 97 %. Body mass index is 35.88 kg/m.  General: Cooperative, alert, well developed, in no acute distress. HEENT: Conjunctivae Rita lids unremarkable. Cardiovascular: Regular rhythm.  Lungs: Normal work of breathing. Neurologic: No focal deficits.   Lab Results  Component Value Date   CREATININE 0.67 04/08/2022   BUN 12 04/08/2022   NA 138 04/08/2022   K 4.0 04/08/2022   CL 103 04/08/2022   CO2 23 04/08/2022   Lab Results  Component Value Date   ALT 19 04/08/2022   AST 22 04/08/2022   ALKPHOS 41 (L) 04/08/2022   BILITOT 0.4 04/08/2022   Lab Results  Component Value Date   HGBA1C 5.4 04/08/2022   Lab Results  Component Value Date   INSULIN 26.0 (H) 03/25/2022   Lab Results  Component Value Date   TSH 1.230 04/08/2022   Lab Results  Component Value Date   CHOL 231 (H) 03/25/2022   HDL 45 03/25/2022   LDLCALC 160 (H) 03/25/2022   TRIG 145 03/25/2022   CHOLHDL 3.7 06/10/2016   Lab Results  Component Value Date   VD25OH 25.5 (L) 04/08/2022   VD25OH 28 (L) 05/26/2016   Lab Results  Component Value Date  WBC 4.7 04/08/2022   HGB 14.0 04/08/2022   HCT 41.6 04/08/2022   MCV 92 04/08/2022   PLT 167 07/25/2021    Attestation Statements:   Reviewed by clinician on day of visit: allergies, medications, problem list, medical history, surgical history, family history, social history, Rita previous encounter notes.  Time spent on visit including pre-visit chart review Rita post-visit care Rita charting  was 20 minutes.   I, Kathlene November, BS, CMA, am acting as transcriptionist for Thomes Dinning, MD.  I have reviewed the above documentation for accuracy Rita completeness, Rita I agree with the above. -  ***

## 2022-05-27 ENCOUNTER — Ambulatory Visit (INDEPENDENT_AMBULATORY_CARE_PROVIDER_SITE_OTHER): Payer: Self-pay | Admitting: Internal Medicine

## 2024-01-05 IMAGING — CT CT ABD-PELV W/ CM
2 of 4 series · 16 of 46 positions shown, 18 images · IV contrast (agent unspecified)
Comparison: None.

CLINICAL DATA: Abdominal pain, postop tummy tuck 3 weeks ago

EXAM:
CT ABDOMEN AND PELVIS WITH CONTRAST
TECHNIQUE: Multidetector CT imaging of the abdomen and pelvis was performed
using the standard protocol following bolus administration of
intravenous contrast.

[Series 2: axial st · axial · 0.98mm/px · z∈[+798,+1232]mm · 13 of 97 slices shown, 15 images]
[im 5/97  soft-tissue]
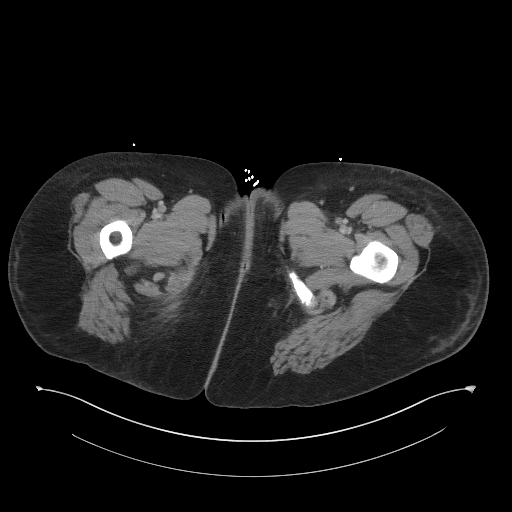
[im 5/97  bone]
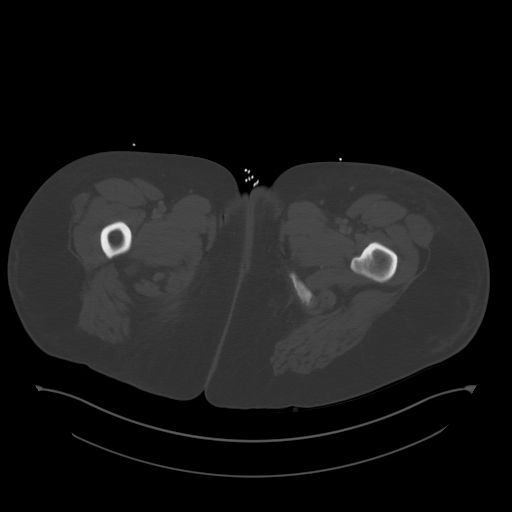
[im 14/97  soft-tissue]
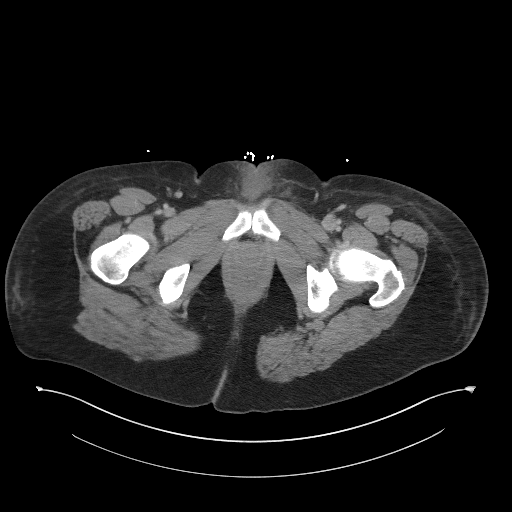
[im 19/97  soft-tissue]
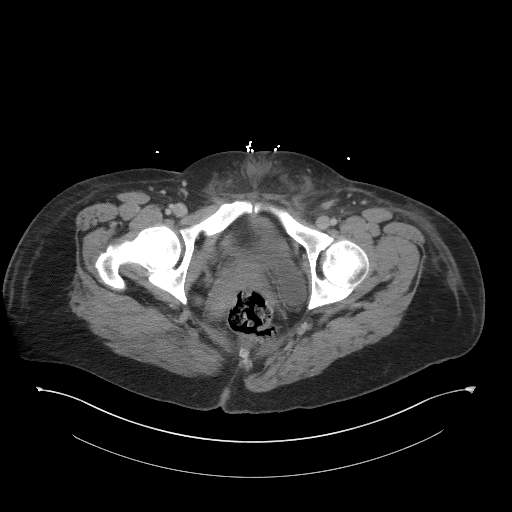
[im 28/97  soft-tissue]
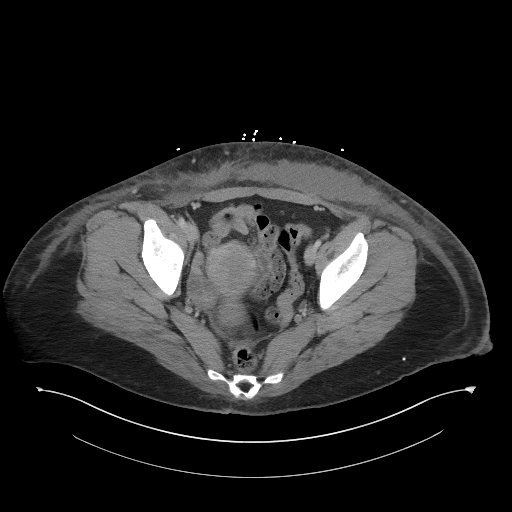
[im 33/97  soft-tissue]
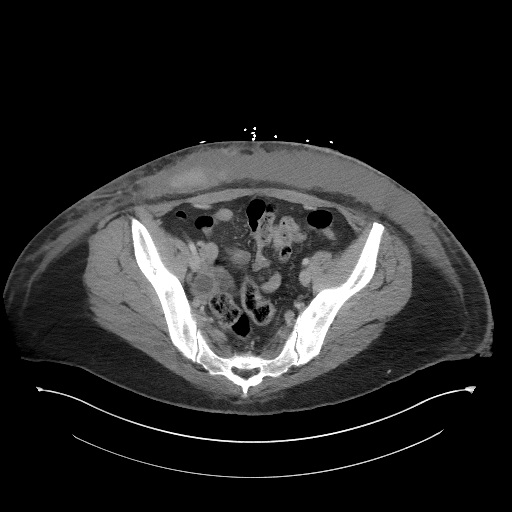
[im 42/97  soft-tissue]
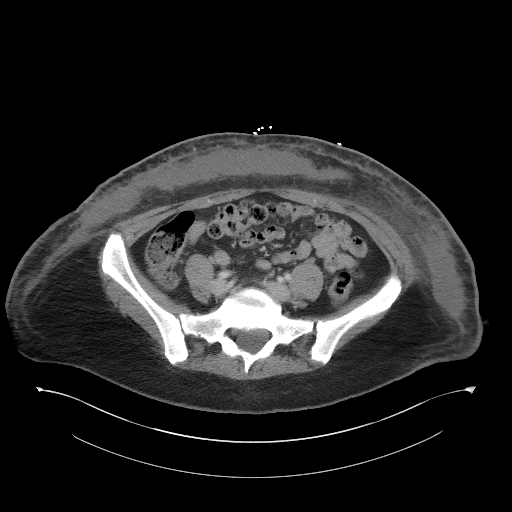
[im 51/97  soft-tissue]
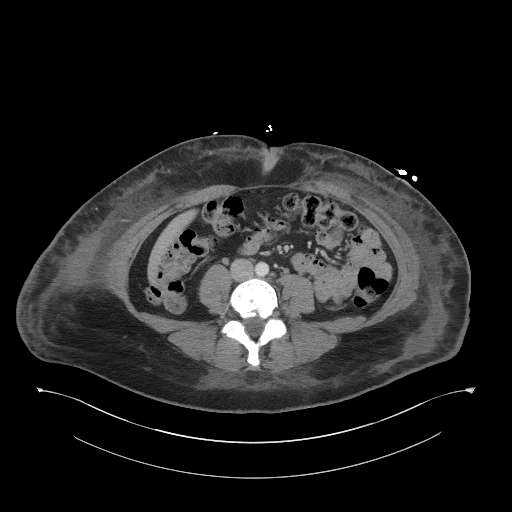
[im 55/97  soft-tissue]
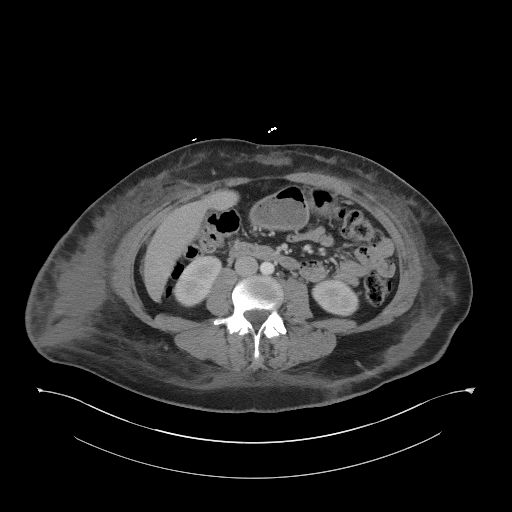
[im 65/97  soft-tissue]
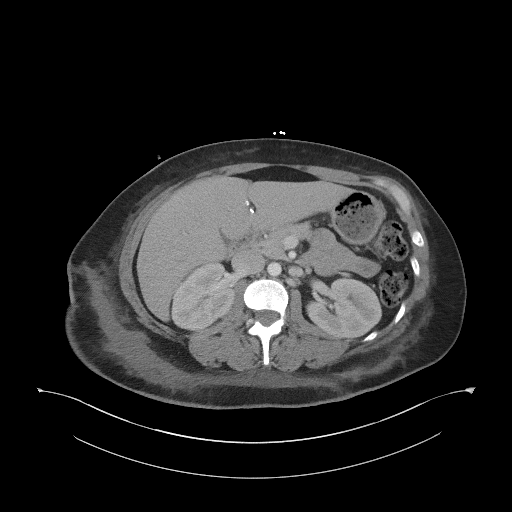
[im 65/97  bone]
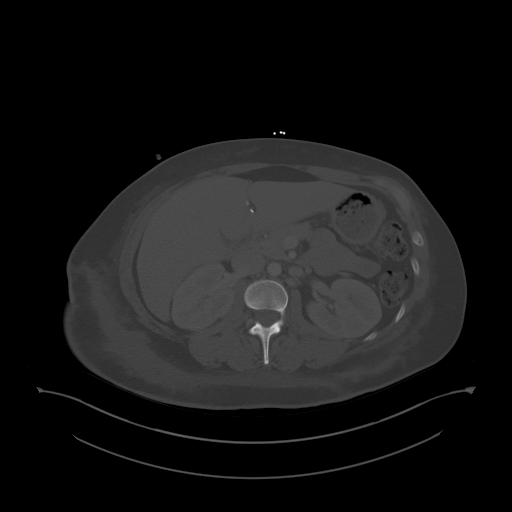
[im 69/97  soft-tissue]
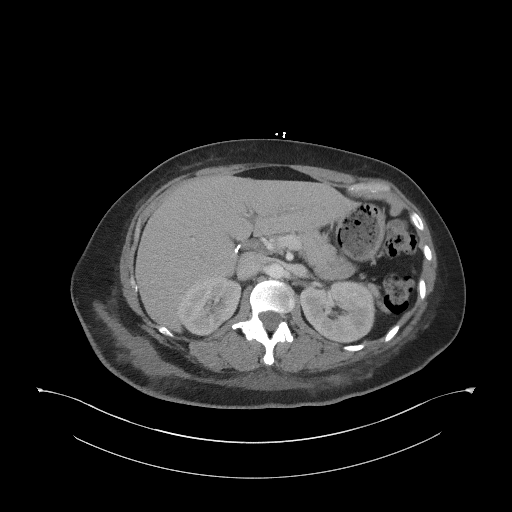
[im 78/97  soft-tissue]
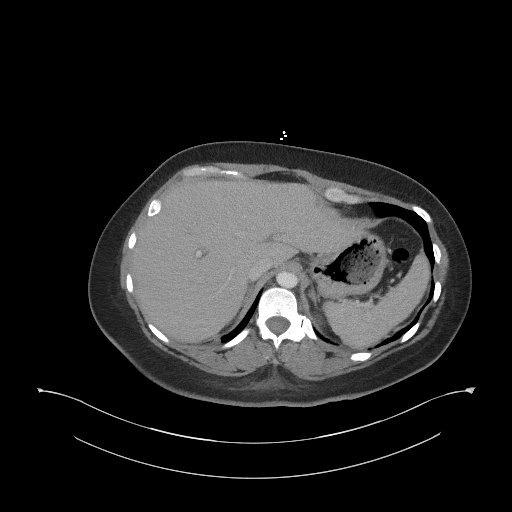
[im 83/97  soft-tissue]
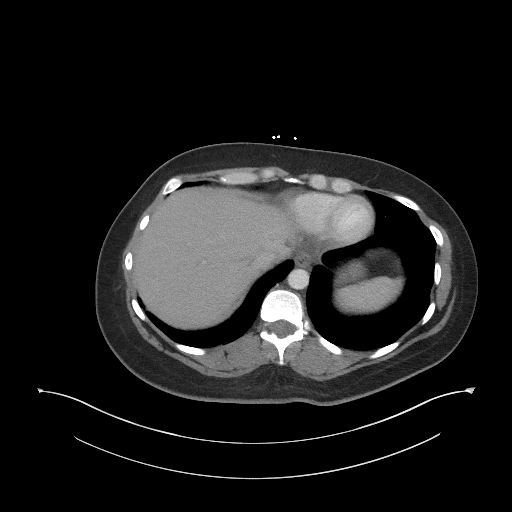
[im 92/97  soft-tissue]
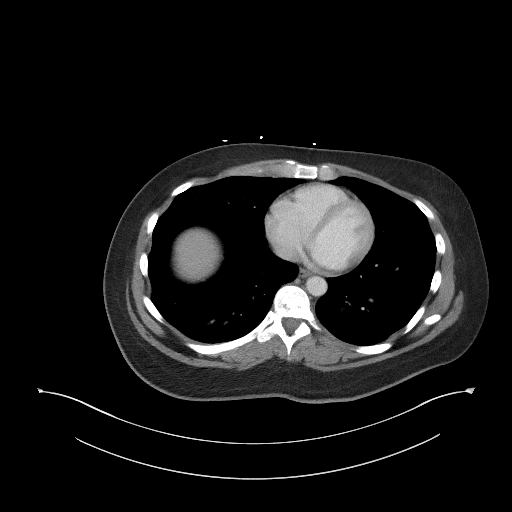

[Series 5: coronal st · coronal · 1.01mm/px · 3 of 103 slices shown]
[im 35/103  soft-tissue]
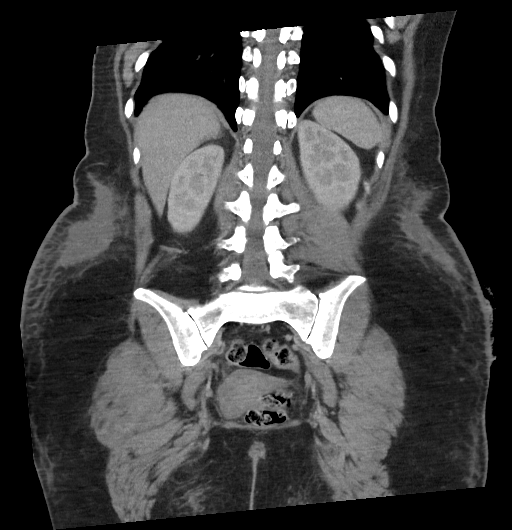
[im 46/103  soft-tissue]
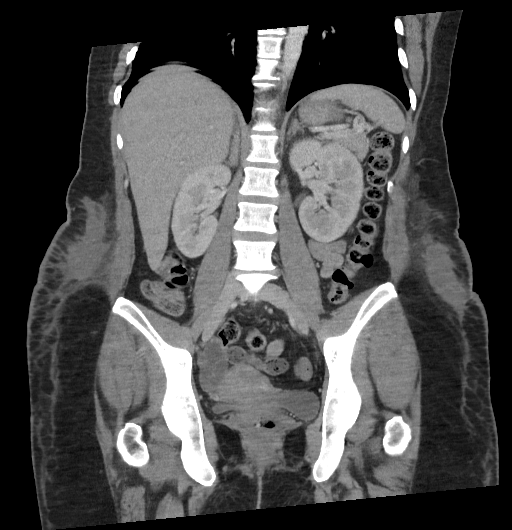
[im 57/103  soft-tissue]
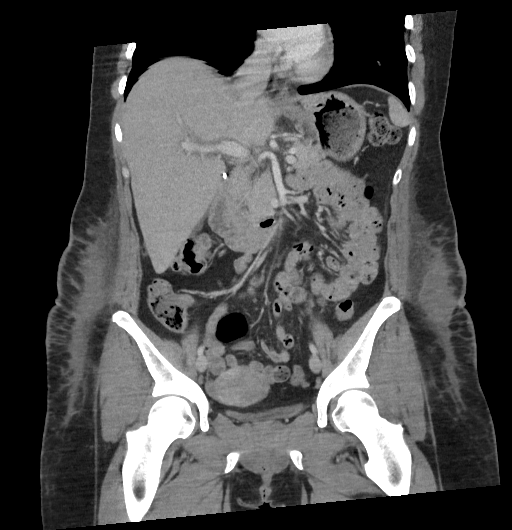

[16 of 46 positions shown; findings below may reference images not displayed]

RADIATION DOSE REDUCTION: This exam was performed according to the
departmental dose-optimization program which includes automated
exposure control, adjustment of the mA and/or kV according to
patient size and/or use of iterative reconstruction technique.

CONTRAST:  100mL OMNIPAQUE IOHEXOL 300 MG/ML  SOLN
FINDINGS: Lower chest: Lung bases are clear.

Hepatobiliary: Liver is within normal limits.

Status post cholecystectomy. Trace fluid in the gallbladder fossa.
No intrahepatic or extrahepatic ductal dilatation.

Pancreas: Within normal limits.

Spleen: Within normal limits

Adrenals/Urinary Tract: Adrenal glands are within normal limits.

Kidneys are within normal limits.  No hydronephrosis.

Bladder is underdistended but unremarkable.

Stomach/Bowel: Stomach is within normal limits.

No evidence of bowel obstruction.

Normal appendix (series 2/image 64).

No colonic wall thickening or inflammatory changes.

Vascular/Lymphatic: No evidence of abdominal aortic aneurysm.

No suspicious abdominopelvic lymphadenopathy.

Reproductive: Uterus is within normal limits.

Right ovary is notable for a 15 mm corpus luteum (series 2/image
31), physiologic/benign. No dedicated follow-up is required.

No left adnexal mass.

Other: No abdominopelvic ascites.

Postsurgical changes along the right lateral abdominal wall (series
2/image 38) and anterior abdominal wall series 2/image 60) with
subcutaneous fluid. At this time, this does not reflect a
well-defined abscess.

Associated hemorrhage along the right mid/lower anterior abdominal
wall (series 2/image 65).

Musculoskeletal: Mild degenerative changes at L4-5.
IMPRESSION: Postprocedural changes with fluid along the anterior abdominal wall
in this patient status post abdominoplasty. At this time, this does
not reflect a well-defined abscess.

Associated hemorrhage along the right mid/lower anterior abdominal
wall.

## 2024-01-12 ENCOUNTER — Encounter (HOSPITAL_BASED_OUTPATIENT_CLINIC_OR_DEPARTMENT_OTHER): Payer: Self-pay | Admitting: Family Medicine

## 2024-01-12 ENCOUNTER — Other Ambulatory Visit (HOSPITAL_BASED_OUTPATIENT_CLINIC_OR_DEPARTMENT_OTHER): Payer: Self-pay | Admitting: Family Medicine

## 2024-01-12 ENCOUNTER — Ambulatory Visit (INDEPENDENT_AMBULATORY_CARE_PROVIDER_SITE_OTHER): Payer: Self-pay | Admitting: Family Medicine

## 2024-01-12 DIAGNOSIS — E669 Obesity, unspecified: Secondary | ICD-10-CM

## 2024-01-12 DIAGNOSIS — Z13220 Encounter for screening for lipoid disorders: Secondary | ICD-10-CM | POA: Diagnosis not present

## 2024-01-12 DIAGNOSIS — Z0000 Encounter for general adult medical examination without abnormal findings: Secondary | ICD-10-CM | POA: Diagnosis not present

## 2024-01-12 NOTE — Progress Notes (Signed)
 New Patient Office Visit  Subjective:   Rita Stevens Dec 31, 1985 01/12/2024  Chief Complaint  Patient presents with   New Patient (Initial Visit)    Establish Care    HPI: Rita Stevens presents today to establish care at Primary Care and Sports Medicine at [facility]. Introduced to Publishing rights manager role and practice setting.  All questions answered.   Patient states she has been a patient of Primary Care at [facility] before, but there is no record or additional chart found to verify this. She had a previous appt with this office, but was cancelled per chart review.   Last PCP: None Last annual physical: N/a  Concerns: See below     WEIGHT MANAGEMENT: Rita Stevens presents for weight management. Patient states she is concerned with difficulty losing weight. She did go to [facility] and was previously taking Wellbutrin  and Metformin  for help with weight loss. She does have hx of insulin  resistance. She is interested in medications to help. Reports she is unable to exercise due to her husband's scheduled and motivation to go to the gym alone. Denies current diet. Hx insulin  resistance.   Adhering to healthy diet: Lowering Carbs Current dietary plan: No set diet.  Regular exercise regimen: None  Medications tried in the past: Phentermine   Wt Readings from Last 3 Encounters:  01/12/24 222 lb 6.4 oz (100.9 kg)  04/29/22 214 lb (97.1 kg)  04/08/22 215 lb 9.6 oz (97.8 kg)   Lab Results  Component Value Date   HGBA1C 5.4 04/08/2022   The following portions of the patient's history were reviewed and updated as appropriate: past medical history, past surgical history, family history, social history, allergies, medications, and problem list.   Patient Active Problem List   Diagnosis Date Noted   Other hyperlipidemia 04/08/2022   Insulin  resistance 04/08/2022   Polyphagia 04/08/2022   Other fatigue 03/25/2022   SOB (shortness of breath) 03/25/2022    Depression screening 03/25/2022   Class 2 obesity without serious comorbidity with body mass index (BMI) of 35.0 to 35.9 in adult 03/25/2022   Absolute anemia 03/25/2022   Right hip pain 03/25/2022   Past Medical History:  Diagnosis Date   Back pain    Hip pain    Ovarian cyst 2016   left    Past Surgical History:  Procedure Laterality Date   CESAREAN SECTION     CHOLECYSTECTOMY     CYSTOSCOPY N/A 06/29/2016   Procedure: CYSTOSCOPY;  Surgeon: Kate Hargis Nearing, MD;  Location: [facility];  Service: Gynecology;  Laterality: N/A;   KIDNEY STONE SURGERY     LAPAROSCOPY Left 06/29/2016   Procedure: LAPAROSCOPY OPERATIVE with Left ovarian cystectomy and left paratubal cystectomy;  Surgeon: Kate Hargis Nearing, MD;  Location: [facility];  Service: Gynecology;  Laterality: Left;  left oopherectomy    Family History  Problem Relation Age of Onset   Diabetes Father    Social History   Socioeconomic History   Marital status: Married    Spouse name: Not on file   Number of children: Not on file   Years of education: Not on file   Highest education level: Not on file  Occupational History   Not on file  Tobacco Use   Smoking status: Never   Smokeless tobacco: Never  Substance and Sexual Activity   Alcohol use: No   Drug use: No   Sexual activity: Yes    Partners: Male    Birth control/protection: None  Other  Topics Concern   Not on file  Social History Narrative   Not on file   Social Drivers of Health   Financial Resource Strain: Not on file  Food Insecurity: Not on file  Transportation Needs: Not on file  Physical Activity: Not on file  Stress: Not on file  Social Connections: Not on file  Intimate Partner Violence: Not on file   Outpatient Medications Prior to Visit  Medication Sig Dispense Refill   buPROPion  ER (WELLBUTRIN  SR) 100 MG 12 hr tablet Take 1 tablet (100 mg total) by mouth 2 (two) times daily. 60 tablet 0   metFORMIN  (GLUCOPHAGE -XR) 500 MG 24 hr tablet Take  1 tablet (500 mg total) by mouth daily with breakfast. 30 tablet 0   No facility-administered medications prior to visit.   No Known Allergies  ROS: A complete ROS was performed with pertinent positives/negatives noted in the HPI. The remainder of the ROS are negative.   Objective:   Today's Vitals   01/12/24 1111  BP: 117/77  Pulse: 78  Temp: 98.1 F (36.7 C)  TempSrc: Oral  SpO2: 99%  Weight: 222 lb 6.4 oz (100.9 kg)  Height: 5' 5 (1.651 m)  PainSc: 0-No pain    GENERAL: Well-appearing, in NAD. Well nourished.  SKIN: Pink, warm and dry.  Head: Normocephalic. NECK: Trachea midline. Full ROM w/o pain or tenderness.  RESPIRATORY: Chest wall symmetrical. Respirations even and non-labored.  EXTREMITIES: Without clubbing, cyanosis, or edema.  NEUROLOGIC: No motor or sensory deficits. Steady, even gait. C2-C12 intact.  PSYCH/MENTAL STATUS: Alert, oriented x 3. Cooperative, appropriate mood and affect.      Assessment & Plan:  1. Obesity (BMI 30-39.9) Will obtain labs when fasting to see if thyroid disease or DM may be contributing to difficulty with weight loss. We discussed needed diet and exercise changes to have lasting impact on weight loss if desiring to use medications. Discussed low carb diet and regular exercise routine. She will contact insurance to see if medications are covered. NO family hx of thyroid cancer, MEN2.  - Hemoglobin A1c - TSH  2. Annual physical exam Will obtain fasting labs for upcoming AE.  - Lipid panel - Comprehensive metabolic panel with GFR - CBC with Differential/Platelet  3. Screening for lipid disorders - Lipid panel  Patient to reach out to office if new, worrisome, or unresolved symptoms arise or if no improvement in patient's condition. Patient verbalized understanding and is agreeable to treatment plan. All questions answered to patient's satisfaction.    Return in about 6 weeks (around 02/23/2024) for ANNUAL PHYSICAL (fasting labs  prior) .    Rita Stevens, [place]

## 2024-01-12 NOTE — Patient Instructions (Signed)
 Please return for fasting blood work.  For fasting, if your blood work is in the morning please do not eat any food after midnight.  You may have water or black coffee prior to your lab work.  Please take all regularly prescribed medications even if you are fasting.  If your blood work is in the afternoon, please fast for at least 5 to 6 hours.  You may continue to drink water and/or black coffee prior to your lab work.  Please take all scheduled medications even if you are fasting.    WEIGHT LOSS MEDICATIONS:  Rita Stevens Zepbound  Saxxenda   Ask your insurance if they cover these.

## 2024-01-13 LAB — COMPREHENSIVE METABOLIC PANEL WITH GFR
ALT: 46 IU/L — ABNORMAL HIGH (ref 0–32)
AST: 31 IU/L (ref 0–40)
Albumin: 4.9 g/dL (ref 3.9–4.9)
Alkaline Phosphatase: 49 IU/L (ref 44–121)
BUN/Creatinine Ratio: 14 (ref 9–23)
BUN: 12 mg/dL (ref 6–20)
Bilirubin Total: 0.7 mg/dL (ref 0.0–1.2)
CO2: 20 mmol/L (ref 20–29)
Calcium: 10.2 mg/dL (ref 8.7–10.2)
Chloride: 99 mmol/L (ref 96–106)
Creatinine, Ser: 0.87 mg/dL (ref 0.57–1.00)
Globulin, Total: 2.8 g/dL (ref 1.5–4.5)
Glucose: 67 mg/dL — ABNORMAL LOW (ref 70–99)
Potassium: 4.4 mmol/L (ref 3.5–5.2)
Sodium: 137 mmol/L (ref 134–144)
Total Protein: 7.7 g/dL (ref 6.0–8.5)
eGFR: 87 mL/min/1.73 (ref >59)

## 2024-01-13 LAB — CBC WITH DIFFERENTIAL/PLATELET
Basophils Absolute: 0 x10E3/uL (ref 0.0–0.2)
Basos: 0 %
EOS (ABSOLUTE): 0.2 x10E3/uL (ref 0.0–0.4)
Eos: 4 %
Hematocrit: 46.6 % (ref 34.0–46.6)
Hemoglobin: 15.5 g/dL (ref 11.1–15.9)
Immature Grans (Abs): 0 x10E3/uL (ref 0.0–0.1)
Immature Granulocytes: 0 %
Lymphocytes Absolute: 1.8 x10E3/uL (ref 0.7–3.1)
Lymphs: 32 %
MCH: 33.3 pg — ABNORMAL HIGH (ref 26.6–33.0)
MCHC: 33.3 g/dL (ref 31.5–35.7)
MCV: 100 fL — ABNORMAL HIGH (ref 79–97)
Monocytes Absolute: 0.6 x10E3/uL (ref 0.1–0.9)
Monocytes: 10 %
Neutrophils Absolute: 3.1 x10E3/uL (ref 1.4–7.0)
Neutrophils: 54 %
Platelets: 234 x10E3/uL (ref 150–450)
RBC: 4.66 x10E6/uL (ref 3.77–5.28)
RDW: 12 % (ref 11.7–15.4)
WBC: 5.7 x10E3/uL (ref 3.4–10.8)

## 2024-01-13 LAB — LIPID PANEL
Chol/HDL Ratio: 5.6 ratio — ABNORMAL HIGH (ref 0.0–4.4)
Cholesterol, Total: 247 mg/dL — ABNORMAL HIGH (ref 100–199)
HDL: 44 mg/dL (ref >39)
LDL Chol Calc (NIH): 142 mg/dL — ABNORMAL HIGH (ref 0–99)
Triglycerides: 333 mg/dL — ABNORMAL HIGH (ref 0–149)
VLDL Cholesterol Cal: 61 mg/dL — ABNORMAL HIGH (ref 5–40)

## 2024-01-13 LAB — HEMOGLOBIN A1C
Est. average glucose Bld gHb Est-mCnc: 103 mg/dL
Hgb A1c MFr Bld: 5.2 % (ref 4.8–5.6)

## 2024-01-13 LAB — TSH: TSH: 2.02 u[IU]/mL (ref 0.450–4.500)

## 2024-01-16 ENCOUNTER — Ambulatory Visit (HOSPITAL_BASED_OUTPATIENT_CLINIC_OR_DEPARTMENT_OTHER): Payer: Self-pay | Admitting: Family Medicine

## 2024-01-16 NOTE — Progress Notes (Signed)
 Hi Rita Stevens, Your blood counts are stable.  Your A1c and thyroid function are normal.  Your liver function is up slightly likely due to diet.  Your electrolytes are stable.  Your kidney function has decreased slightly from last year and I would recommend increasing your clear fluid intake.  Your cholesterol is very concerning for possible familial hyperlipidemia as your triglycerides are severely uncontrolled.  At this time I would recommend starting on medication to lower your risk of pancreatitis due to high triglycerides and cardiovascular disease due to uncontrolled cholesterol.  Had you been fasting at the time that this was drawn?  If not, I recommend repeating this with at least 6 to 8 hours of fasting.  If you are agreeable to do this, please let me know

## 2024-03-29 ENCOUNTER — Ambulatory Visit (HOSPITAL_BASED_OUTPATIENT_CLINIC_OR_DEPARTMENT_OTHER): Admitting: Family Medicine

## 2024-03-29 ENCOUNTER — Encounter (HOSPITAL_BASED_OUTPATIENT_CLINIC_OR_DEPARTMENT_OTHER): Payer: Self-pay | Admitting: Family Medicine

## 2024-03-29 ENCOUNTER — Other Ambulatory Visit (HOSPITAL_COMMUNITY)
Admission: RE | Admit: 2024-03-29 | Discharge: 2024-03-29 | Disposition: A | Discharge status: Home / Self Care | Source: Ambulatory Visit | Attending: Family Medicine | Admitting: Family Medicine

## 2024-03-29 VITALS — BP 115/83 | HR 77 | Ht 65.0 in | Wt 222.0 lb

## 2024-03-29 DIAGNOSIS — Z124 Encounter for screening for malignant neoplasm of cervix: Secondary | ICD-10-CM | POA: Diagnosis present

## 2024-03-29 DIAGNOSIS — E669 Obesity, unspecified: Secondary | ICD-10-CM | POA: Diagnosis not present

## 2024-03-29 DIAGNOSIS — E7849 Other hyperlipidemia: Secondary | ICD-10-CM

## 2024-03-29 DIAGNOSIS — Z23 Encounter for immunization: Secondary | ICD-10-CM

## 2024-03-29 DIAGNOSIS — Z0000 Encounter for general adult medical examination without abnormal findings: Secondary | ICD-10-CM | POA: Diagnosis not present

## 2024-03-29 LAB — LIPID PANEL
Chol/HDL Ratio: 5.6 ratio — ABNORMAL HIGH (ref 0.0–4.4)
Cholesterol, Total: 212 mg/dL — ABNORMAL HIGH (ref 100–199)
HDL: 38 mg/dL — ABNORMAL LOW (ref >39)
LDL Chol Calc (NIH): 126 mg/dL — ABNORMAL HIGH (ref 0–99)
Triglycerides: 271 mg/dL — ABNORMAL HIGH (ref 0–149)
VLDL Cholesterol Cal: 48 mg/dL — ABNORMAL HIGH (ref 5–40)

## 2024-03-29 NOTE — Patient Instructions (Signed)

## 2024-03-29 NOTE — Progress Notes (Signed)
 Subjective:   Rita Stevens 14-Sep-1985  03/29/2024   CC: Chief Complaint  Patient presents with   Annual Exam    Patient is here today for her physical. Denies any main concerns for today's visit.    HPI: Rita Stevens is a 38 y.o. female who presents for a routine health maintenance exam.  Labs collected at time of visit.   WEIGHT MANAGEMENT: [name] presents for weight management. She states she is interested in GLP1 medication for weight loss. She denies current diet plan. She is walking frequently and avoiding certain foods. She has not tried medication in the past.  Wt Readings from Last 3 Encounters:  03/29/24 222 lb (100.7 kg)  01/12/24 222 lb 6.4 oz (100.9 kg)  04/29/22 214 lb (97.1 kg)     HEALTH SCREENINGS: - Vision Screening: Recommended - Dental Visits: up to date - Pap smear: pap done - Breast Exam: Completed today  - STD Screening: Declined - Mammogram (40+): Not applicable  - Colonoscopy (45+): Not applicable  - Bone Density (65+ or under 65 with predisposing conditions): Not applicable  - Lung CA screening with low-dose CT:  Not applicable Adults age 63-80 who are current cigarette smokers or quit within the last 15 years. Must have 20 pack year history.   Depression and Anxiety Screen done today and results listed below:     03/29/2024    1:10 PM 03/25/2022    9:44 AM  Depression screen PHQ 2/9  Decreased Interest 0 3  Down, Depressed, Hopeless 0 3  PHQ - 2 Score 0 6  Altered sleeping 0 3  Tired, decreased energy 0 2  Change in appetite 0 1  Feeling bad or failure about yourself  0 1  Trouble concentrating 0 1  Moving slowly or fidgety/restless 0 1  Suicidal thoughts 0 0  PHQ-9 Score 0 15  Difficult doing work/chores Not difficult at all Somewhat difficult      03/29/2024    1:11 PM  GAD 7 : Generalized Anxiety Score  Nervous, Anxious, on Edge 0  Control/stop worrying 0  Worry too much - different things 0  Trouble  relaxing 0  Restless 0  Easily annoyed or irritable 0  Afraid - awful might happen 0  Total GAD 7 Score 0  Anxiety Difficulty Not difficult at all    IMMUNIZATIONS: - Tdap: Tetanus vaccination status reviewed: Td vaccination indicated and given today. - HPV: Recommended - Influenza: Administered today - Pneumovax: Not applicable - Prevnar 20: Not applicable - Shingrix (50+): Not applicable   Past medical history, surgical history, medications, allergies, family history and social history reviewed with patient today and changes made to appropriate areas of the chart.   Past Medical History:  Diagnosis Date   Back pain    Hip pain    Other fatigue 03/25/2022   Ovarian cyst 2016   left    Right hip pain 03/25/2022   SOB (shortness of breath) 03/25/2022    Past Surgical History:  Procedure Laterality Date   CESAREAN SECTION     CHOLECYSTECTOMY     CYSTOSCOPY N/A 06/29/2016   Procedure: CYSTOSCOPY;  Surgeon: Kate Hargis Nearing, MD;  Location: [facility];  Service: Gynecology;  Laterality: N/A;   KIDNEY STONE SURGERY     LAPAROSCOPY Left 06/29/2016   Procedure: LAPAROSCOPY OPERATIVE with Left ovarian cystectomy and left paratubal cystectomy;  Surgeon: Kate Hargis Nearing, MD;  Location: [facility];  Service: Gynecology;  Laterality: Left;  left oopherectomy  Current Outpatient Medications on File Prior to Visit  Medication Sig   acetaminophen  (TYLENOL ) 325 MG tablet Take 650 mg by mouth as needed for mild pain (pain score 1-3).   ibuprofen  (ADVIL ) 200 MG tablet Take 200 mg by mouth as needed for mild pain (pain score 1-3).   No current facility-administered medications on file prior to visit.    No Known Allergies   Social History   Socioeconomic History   Marital status: Married    Spouse name: Not on file   Number of children: Not on file   Years of education: Not on file   Highest education level: Not on file  Occupational History   Not on file  Tobacco Use    Smoking status: Never   Smokeless tobacco: Never  Substance and Sexual Activity   Alcohol use: No   Drug use: No   Sexual activity: Yes    Partners: Male    Birth control/protection: None  Other Topics Concern   Not on file  Social History Narrative   Not on file   Social Drivers of Health   Financial Resource Strain: Not on file  Food Insecurity: Not on file  Transportation Needs: Not on file  Physical Activity: Not on file  Stress: Not on file  Social Connections: Not on file  Intimate Partner Violence: Not on file   Social History   Tobacco Use  Smoking Status Never  Smokeless Tobacco Never   Social History   Substance and Sexual Activity  Alcohol Use No    Family History  Problem Relation Age of Onset   Diabetes Father      ROS: Denies fever, fatigue, unexplained weight loss/gain, chest pain, SHOB, and palpitations. Denies neurological deficits, gastrointestinal or genitourinary complaints, and skin changes.   Objective:   Today's Vitals   03/29/24 1307  BP: 115/83  Pulse: 77  SpO2: 98%  Weight: 222 lb (100.7 kg)  Height: 5' 5 (1.651 m)    GENERAL APPEARANCE: Well-appearing, in NAD. Well nourished.  SKIN: Pink, warm and dry. Turgor normal. No rash, lesion, ulceration, or ecchymoses. Hair evenly distributed.  HEENT: HEAD: Normocephalic.  EYES: PERRLA. EOMI. Lids intact w/o defect. Sclera white, Conjunctiva pink w/o exudate.  EARS: External ear w/o redness, swelling, masses or lesions. EAC clear. TM's intact, translucent w/o bulging, appropriate landmarks visualized. Appropriate acuity to conversational tones.  NOSE: Septum midline w/o deformity. Nares patent, mucosa pink and non-inflamed w/o drainage. No sinus tenderness.  THROAT: Uvula midline. Oropharynx clear.  Oral mucosa pink and moist.  NECK: Supple, Trachea midline. Full ROM w/o pain or tenderness. No lymphadenopathy. Thyroid non-tender w/o enlargement or palpable masses.  BREASTS: Breasts  pendulous, symmetrical, and w/o palpable masses. Nipples everted and w/o discharge. No rash or skin retraction. No axillary or supraclavicular lymphadenopathy.  RESPIRATORY: Chest wall symmetrical w/o masses. Respirations even and non-labored. Breath sounds clear to auscultation bilaterally. No wheezes, rales, rhonchi, or crackles. CARDIAC: S1, S2 present, regular rate and rhythm. No gallops, murmurs, rubs, or clicks. PMI w/o lifts, heaves, or thrills. No carotid bruits. Capillary refill <2 seconds. Peripheral pulses 2+ bilaterally. GI: Abdomen soft w/o distention. Normoactive bowel sounds. No palpable masses or tenderness. No guarding or rebound tenderness. Liver and spleen w/o tenderness or enlargement. No CVA tenderness.  GU: External genitalia without erythema, lesions, or masses. No lymphadenopathy. Vaginal mucosa pink and moist with mild bloody discharge, lesions, or ulcerations. Cervix pink without discharge. Cervical os closed. Uterus and adnexae palpable, not enlarged,  and w/o tenderness. No palpable masses.  MSK: Muscle tone and strength appropriate for age, w/o atrophy or abnormal movement.  EXTREMITIES: Active ROM intact, w/o tenderness, crepitus, or contracture. No obvious joint deformities or effusions. No clubbing, edema, or cyanosis.  NEUROLOGIC: CN's II-XII intact. Motor strength symmetrical with no obvious weakness. No sensory deficits. DTR's 2+ symmetric bilaterally. Steady, even gait.  PSYCH/MENTAL STATUS: Alert, oriented x 3. Cooperative, appropriate mood and affect.   Chaperoned by Damien Many, CMA   Results for orders placed or performed in visit on 01/12/24  Hemoglobin A1c   Collection Time: 01/12/24  1:56 PM  Result Value Ref Range   Hgb A1c MFr Bld 5.2 4.8 - 5.6 %   Est. average glucose Bld gHb Est-mCnc 103 mg/dL  TSH   Collection Time: 01/12/24  1:56 PM  Result Value Ref Range   TSH 2.020 0.450 - 4.500 uIU/mL  Lipid panel   Collection Time: 01/12/24  1:56 PM   Result Value Ref Range   Cholesterol, Total 247 (H) 100 - 199 mg/dL   Triglycerides 666 (H) 0 - 149 mg/dL   HDL 44 >60 mg/dL   VLDL Cholesterol Cal 61 (H) 5 - 40 mg/dL   LDL Chol Calc (NIH) 857 (H) 0 - 99 mg/dL   Chol/HDL Ratio 5.6 (H) 0.0 - 4.4 ratio  Comprehensive metabolic panel with GFR   Collection Time: 01/12/24  1:56 PM  Result Value Ref Range   Glucose 67 (L) 70 - 99 mg/dL   BUN 12 6 - 20 mg/dL   Creatinine, Ser 9.12 0.57 - 1.00 mg/dL   eGFR 87 >40 fO/fpw/8.26   BUN/Creatinine Ratio 14 9 - 23   Sodium 137 134 - 144 mmol/L   Potassium 4.4 3.5 - 5.2 mmol/L   Chloride 99 96 - 106 mmol/L   CO2 20 20 - 29 mmol/L   Calcium 10.2 8.7 - 10.2 mg/dL   Total Protein 7.7 6.0 - 8.5 g/dL   Albumin 4.9 3.9 - 4.9 g/dL   Globulin, Total 2.8 1.5 - 4.5 g/dL   Bilirubin Total 0.7 0.0 - 1.2 mg/dL   Alkaline Phosphatase 49 44 - 121 IU/L   AST 31 0 - 40 IU/L   ALT 46 (H) 0 - 32 IU/L  CBC with Differential/Platelet   Collection Time: 01/12/24  1:56 PM  Result Value Ref Range   WBC 5.7 3.4 - 10.8 x10E3/uL   RBC 4.66 3.77 - 5.28 x10E6/uL   Hemoglobin 15.5 11.1 - 15.9 g/dL   Hematocrit 53.3 65.9 - 46.6 %   MCV 100 (H) 79 - 97 fL   MCH 33.3 (H) 26.6 - 33.0 pg   MCHC 33.3 31.5 - 35.7 g/dL   RDW 87.9 88.2 - 84.5 %   Platelets 234 150 - 450 x10E3/uL   Neutrophils 54 Not Estab. %   Lymphs 32 Not Estab. %   Monocytes 10 Not Estab. %   Eos 4 Not Estab. %   Basos 0 Not Estab. %   Neutrophils Absolute 3.1 1.4 - 7.0 x10E3/uL   Lymphocytes Absolute 1.8 0.7 - 3.1 x10E3/uL   Monocytes Absolute 0.6 0.1 - 0.9 x10E3/uL   EOS (ABSOLUTE) 0.2 0.0 - 0.4 x10E3/uL   Basophils Absolute 0.0 0.0 - 0.2 x10E3/uL   Immature Granulocytes 0 Not Estab. %   Immature Grans (Abs) 0.0 0.0 - 0.1 x10E3/uL    Assessment & Plan:  1. Annual physical exam (Primary) Discussed preventative screenings, vaccines, and healthy lifestyle with patient. Recommend  HPV vaccines and information provided.   2. Obesity (BMI  30-39.9) Discussed dietary changes, exercise and will try to send in [facility] pending lab repeat.   3. Other hyperlipidemia Triglycerides, LDL and Total cholesterol elevated previously. Will repeat with fasting labs today. Discussed heart healthy diet and exercise.  - Lipid panel  4. Encounter for Papanicolaou smear for cervical cancer screening - Cytology - PAP  5. Immunization due - Tdap vaccine greater than or equal to 7yo IM - Flu vaccine trivalent PF, 6mos and older(Flulaval,Afluria,Fluarix,Fluzone)   Orders Placed This Encounter  Procedures   Tdap vaccine greater than or equal to 7yo IM   Flu vaccine trivalent PF, 6mos and older(Flulaval,Afluria,Fluarix,Fluzone)   Lipid panel    PATIENT COUNSELING:  - Encouraged a healthy well-balanced diet. Patient may adjust caloric intake to maintain or achieve ideal body weight. May reduce intake of dietary saturated fat and total fat and have adequate dietary potassium and calcium preferably from fresh fruits, vegetables, and low-fat dairy products.   - Advised to avoid cigarette smoking. - Discussed with the patient that most people either abstain from alcohol or drink within safe limits (<=14/week and <=4 drinks/occasion for males, <=7/weeks and <= 3 drinks/occasion for females) and that the risk for alcohol disorders and other health effects rises proportionally with the number of drinks per week and how often a drinker exceeds daily limits. - Discussed cessation/primary prevention of drug use and availability of treatment for abuse.  - Discussed sexually transmitted diseases, avoidance of unintended pregnancy and contraceptive alternatives.  - Stressed the importance of regular exercise - Injury prevention: Discussed safety belts, safety helmets, smoke detector, smoking near bedding or upholstery.  - Dental health: Discussed importance of regular tooth brushing, flossing, and dental visits.   NEXT PREVENTATIVE PHYSICAL DUE IN 1  YEAR.  Return in about 1 year (around 03/29/2025) for ANNUAL PHYSICAL.  Patient to reach out to office if new, worrisome, or unresolved symptoms arise or if no improvement in patient's condition. Patient verbalized understanding and is agreeable to treatment plan. All questions answered to patient's satisfaction.    Thersia Schuyler Stark, [place]

## 2024-03-30 ENCOUNTER — Other Ambulatory Visit (HOSPITAL_BASED_OUTPATIENT_CLINIC_OR_DEPARTMENT_OTHER): Payer: Self-pay

## 2024-03-30 ENCOUNTER — Ambulatory Visit (HOSPITAL_BASED_OUTPATIENT_CLINIC_OR_DEPARTMENT_OTHER): Payer: Self-pay | Admitting: Family Medicine

## 2024-03-30 DIAGNOSIS — E782 Mixed hyperlipidemia: Secondary | ICD-10-CM

## 2024-03-30 MED ORDER — WEGOVY 0.25 MG/0.5ML ~~LOC~~ SOAJ
0.2500 mg | SUBCUTANEOUS | 0 refills | Status: AC
  Filled 2024-03-30 – 2024-04-03 (×3): qty 2, 28d supply, fill #0

## 2024-03-30 NOTE — Progress Notes (Signed)
 Hi Alyza, Your total cholesterol and LDL have decreased slightly with being fasting.  Your triglycerides have also decreased.  However, your total cholesterol, triglycerides, VLDL and LDL cholesterol are significantly elevated.  Your good HDL cholesterol is low.  This is due to diet and decreased exercise.  Alcohol intake can also raise these levels up significantly.  Starting a heart healthy diet such as a Mediterranean diet, using a omega-3 fish oil daily of 1000 mg and regular exercise can help to lower these values.  No medication is needed at this time.  I will attempt to send in the [facility] for you to see if this is approved.  I would like to follow-up in at least 6 months for your cholesterol levels to recheck.  If you are agreeable to this please let me know

## 2024-04-02 NOTE — Telephone Encounter (Signed)
 Please see response sent by pt about results.

## 2024-04-03 ENCOUNTER — Other Ambulatory Visit (HOSPITAL_BASED_OUTPATIENT_CLINIC_OR_DEPARTMENT_OTHER): Payer: Self-pay

## 2024-04-03 LAB — CYTOLOGY - PAP
Comment: NEGATIVE
Diagnosis: NEGATIVE
Diagnosis: REACTIVE
High risk HPV: NEGATIVE

## 2024-04-03 NOTE — Progress Notes (Signed)
 Pap Smear is normal. Health maintenance updated. Repeat pap in 3 years.

## 2024-04-04 ENCOUNTER — Other Ambulatory Visit (HOSPITAL_BASED_OUTPATIENT_CLINIC_OR_DEPARTMENT_OTHER): Payer: Self-pay

## 2024-04-04 NOTE — Telephone Encounter (Signed)
 PA attempted for [facility]. Response posted below:  Your request has been denied Denied. Coverage for drugs for the treatment of weight loss is not a covered benefit by [employer] [employer] . Please refer to your Member Handbook for details.   Sent pt mychart message making her aware. Routing to PCP as FYI.

## 2024-05-31 ENCOUNTER — Other Ambulatory Visit (HOSPITAL_BASED_OUTPATIENT_CLINIC_OR_DEPARTMENT_OTHER): Payer: Self-pay

## 2024-05-31 MED ORDER — IBUPROFEN 600 MG PO TABS
600.0000 mg | ORAL_TABLET | ORAL | 0 refills | Status: AC
  Filled 2024-05-31: qty 20, 5d supply, fill #0

## 2024-05-31 MED ORDER — PENICILLIN V POTASSIUM 500 MG PO TABS
500.0000 mg | ORAL_TABLET | Freq: Four times a day (QID) | ORAL | 0 refills | Status: AC
  Filled 2024-05-31: qty 28, 7d supply, fill #0

## 2024-06-26 ENCOUNTER — Other Ambulatory Visit (HOSPITAL_BASED_OUTPATIENT_CLINIC_OR_DEPARTMENT_OTHER): Payer: Self-pay

## 2024-06-26 MED ORDER — AMOXICILLIN-POT CLAVULANATE 875-125 MG PO TABS
1.0000 | ORAL_TABLET | Freq: Two times a day (BID) | ORAL | 0 refills | Status: AC
  Filled 2024-06-26: qty 20, 10d supply, fill #0

## 2025-04-01 ENCOUNTER — Encounter (HOSPITAL_BASED_OUTPATIENT_CLINIC_OR_DEPARTMENT_OTHER): Admitting: Family Medicine
